# Patient Record
Sex: Female | Born: 1955
Health system: Southern US, Community
[De-identification: ages and names within clinical notes are randomized; demographics above are authoritative.]

## PROBLEM LIST (undated history)

## (undated) DIAGNOSIS — F419 Anxiety disorder, unspecified: Secondary | ICD-10-CM

## (undated) DIAGNOSIS — C801 Malignant (primary) neoplasm, unspecified: Secondary | ICD-10-CM

## (undated) DIAGNOSIS — F32A Depression, unspecified: Secondary | ICD-10-CM

## (undated) DIAGNOSIS — M1990 Unspecified osteoarthritis, unspecified site: Secondary | ICD-10-CM

## (undated) DIAGNOSIS — F329 Major depressive disorder, single episode, unspecified: Secondary | ICD-10-CM

## (undated) DIAGNOSIS — J45909 Unspecified asthma, uncomplicated: Secondary | ICD-10-CM

## (undated) DIAGNOSIS — M8580 Other specified disorders of bone density and structure, unspecified site: Secondary | ICD-10-CM

## (undated) DIAGNOSIS — H269 Unspecified cataract: Secondary | ICD-10-CM

## (undated) DIAGNOSIS — I10 Essential (primary) hypertension: Secondary | ICD-10-CM

## (undated) HISTORY — DX: Other specified disorders of bone density and structure, unspecified site: M85.80

## (undated) HISTORY — PX: TUBAL LIGATION: SHX77

## (undated) HISTORY — PX: COSMETIC SURGERY: SHX468

## (undated) HISTORY — DX: Unspecified cataract: H26.9

## (undated) HISTORY — DX: Unspecified osteoarthritis, unspecified site: M19.90

## (undated) HISTORY — DX: Essential (primary) hypertension: I10

## (undated) HISTORY — DX: Malignant (primary) neoplasm, unspecified: C80.1

## (undated) HISTORY — PX: AUGMENTATION MAMMAPLASTY: SUR837

## (undated) HISTORY — PX: TONSILLECTOMY: SUR1361

## (undated) HISTORY — DX: Anxiety disorder, unspecified: F41.9

## (undated) HISTORY — PX: CERVICAL DISCECTOMY: SHX98

## (undated) HISTORY — PX: APPENDECTOMY: SHX54

## (undated) HISTORY — PX: BUNIONECTOMY: SHX129

## (undated) HISTORY — DX: Depression, unspecified: F32.A

## (undated) HISTORY — DX: Major depressive disorder, single episode, unspecified: F32.9

## (undated) HISTORY — PX: REDUCTION MAMMAPLASTY: SUR839

## (undated) HISTORY — DX: Unspecified asthma, uncomplicated: J45.909

---

## 2009-01-04 ENCOUNTER — Ambulatory Visit (HOSPITAL_BASED_OUTPATIENT_CLINIC_OR_DEPARTMENT_OTHER): Admission: RE | Admit: 2009-01-04 | Discharge: 2009-01-05 | Payer: Self-pay | Admitting: Orthopedic Surgery

## 2010-12-28 LAB — BASIC METABOLIC PANEL
GFR calc non Af Amer: >60 mL/min (ref >60)
Glucose, Bld: 123 mg/dL — ABNORMAL HIGH (ref 70–99)
Potassium: 3.7 mEq/L (ref 3.5–5.1)
Sodium: 137 mEq/L (ref 135–145)

## 2010-12-28 LAB — POCT HEMOGLOBIN-HEMACUE: Hemoglobin: 14 g/dL (ref 12.0–15.0)

## 2011-01-31 NOTE — Op Note (Signed)
Monica Monroe, Monica Monroe               ACCOUNT NO.:  [id number]   MEDICAL RECORD NO.:  [id number]          PATIENT TYPE:  AMB   LOCATION:  DSC                          FACILITY:  [facility]   PHYSICIAN:  Robert A. Thurston Hole, M.D. DATE OF BIRTH:  02-12-1956   DATE OF PROCEDURE:  01/04/2009  DATE OF DISCHARGE:                               OPERATIVE REPORT   PREOPERATIVE DIAGNOSES:  1. Right shoulder adhesive capsulitis.  2. Right shoulder rotator cuff tenonitis with impingement.   POSTOPERATIVE DIAGNOSES:  1. Right shoulder adhesive capsulitis.  2. Right shoulder rotator cuff tenonitis with impingement.   PROCEDURES:  1. Right shoulder examination under anesthesia followed by      manipulation under anesthesia.  2. Right shoulder arthroscopic lysis of adhesions.  3. Right shoulder subacromial decompression.   SURGEON:  Elana Alm. Thurston Hole, MD   ASSISTANT:  Genene Churn. Barry Dienes, PA   ANESTHESIA:  General.   OPERATIVE TIME:  45 minutes.   COMPLICATIONS:  None.   INDICATIONS FOR PROCEDURE:  Monica Monroe is a 55 year old woman who has  had significant right shoulder pain and decrease range of motion and  adhesive capsulitis developing over the last 8-10 months, increasing in  nature with exam on MRI documenting this.  She has failed conservative  care and is now to undergo EUA, manipulation, arthroscopy, and lysis of  adhesions.   DESCRIPTION:  Monica Monroe was brought to the operating room on January 04, 2009, after an interscalene block was placed in the holding by  Anesthesia.  She was placed on the operating table in supine position.  After being placed under general anesthesia, her right shoulder was  examined.  Her initial range of motion showed forward flexion 150,  abduction 150, and internal and external rotation of 50 degrees.  A  gentle manipulation was carried out breaking up adhesions and improving  range of motion to full range of motion.  The shoulder remained stable  on  ligamentous exam.  She was then placed in a beach-chair position, and  her shoulder and arm were prepped using sterile DuraPrep and draped  using sterile technique.  Originally, through a posterior arthroscopic  portal, the arthroscope with a pump attached was placed into an anterior  portal, and arthroscopic probe was placed.  On initial inspection, the  articular cartilage in the glenohumeral joint was intact.  The anterior,  superior, and posterior labrum were intact.  The biceps tendon anchor  and biceps tendon was intact.  The anteroinferior labrum and  anteroinferior glenohumeral ligament complex was intact.  Rotator cuff  showed some mild interstitial tearing, but no significant tearing and it  was well attached in all its attachment points throughout.  The anterior  capsule and inferior capsule recess showed significant erythematous  synovitis consistent with adhesive capsulitis, this was partially  debrided and cauterized.  Subacromial space was entered and lateral  arthroscopic portal was made.  Moderately thickened bursitis was  resected.  The rotator cuff was somewhat inflamed and thickened on the  bursal surface, but no evidence of a tear.  Impingement was noted and  a  subacromial decompression was carried out removing 6-8 mm of the  undersurface of the anterior, anterolateral and anteromedial acromion,  and CA ligament release carried out as well.  The [facility] joint was not that  logic and thus was not disturbed.  After this was done, the shoulder  could be brought through a full range of motion with no impingement on  the rotator cuff.  At this point, it was felt that all pathology have  been satisfactorily addressed.  The instruments were removed.  The  portal was closed with 3-0 nylon suture.  The shoulder was injected with  80 mg of Depo-Medrol and 10 mL of 0.25% Marcaine with epinephrine with  half of this injected in the joint and half in the subacromial space due  to her  adhesive capsulitis.  Sterile dressings and a sling applied.  The  patient awakened and taken to the recovery room in stable condition.   FOLLOWUP CARE:  Monica Monroe will be followed overnight for observation  and aggressive range of motion exercises and neurovascular monitoring.  She will be discharged tomorrow on Dilaudid, Demerol, and Robaxin with  early aggressive physical therapy.  Seen back in the office in a week  for sutures out and followup.      Robert A. Thurston Hole, M.D.  Electronically Signed     RAW/MEDQ  D:  01/04/2009  T:  01/04/2009  Job:  [id number]

## 2016-02-04 ENCOUNTER — Encounter (INDEPENDENT_AMBULATORY_CARE_PROVIDER_SITE_OTHER): Payer: BLUE CROSS/BLUE SHIELD | Admitting: Ophthalmology

## 2016-02-04 DIAGNOSIS — H43813 Vitreous degeneration, bilateral: Secondary | ICD-10-CM | POA: Diagnosis not present

## 2016-02-04 DIAGNOSIS — H35033 Hypertensive retinopathy, bilateral: Secondary | ICD-10-CM

## 2016-02-04 DIAGNOSIS — I10 Essential (primary) hypertension: Secondary | ICD-10-CM

## 2016-02-04 DIAGNOSIS — H43821 Vitreomacular adhesion, right eye: Secondary | ICD-10-CM

## 2016-02-04 DIAGNOSIS — H59031 Cystoid macular edema following cataract surgery, right eye: Secondary | ICD-10-CM

## 2016-03-17 ENCOUNTER — Encounter (INDEPENDENT_AMBULATORY_CARE_PROVIDER_SITE_OTHER): Payer: BLUE CROSS/BLUE SHIELD | Admitting: Ophthalmology

## 2016-03-17 DIAGNOSIS — H35372 Puckering of macula, left eye: Secondary | ICD-10-CM | POA: Diagnosis not present

## 2016-03-17 DIAGNOSIS — H35033 Hypertensive retinopathy, bilateral: Secondary | ICD-10-CM

## 2016-03-17 DIAGNOSIS — H59031 Cystoid macular edema following cataract surgery, right eye: Secondary | ICD-10-CM | POA: Diagnosis not present

## 2016-03-17 DIAGNOSIS — H43813 Vitreous degeneration, bilateral: Secondary | ICD-10-CM | POA: Diagnosis not present

## 2016-03-17 DIAGNOSIS — I10 Essential (primary) hypertension: Secondary | ICD-10-CM

## 2016-04-28 ENCOUNTER — Encounter (INDEPENDENT_AMBULATORY_CARE_PROVIDER_SITE_OTHER): Payer: BLUE CROSS/BLUE SHIELD | Admitting: Ophthalmology

## 2016-04-28 DIAGNOSIS — I10 Essential (primary) hypertension: Secondary | ICD-10-CM

## 2016-04-28 DIAGNOSIS — H35033 Hypertensive retinopathy, bilateral: Secondary | ICD-10-CM | POA: Diagnosis not present

## 2016-04-28 DIAGNOSIS — H43813 Vitreous degeneration, bilateral: Secondary | ICD-10-CM

## 2016-04-28 DIAGNOSIS — H59031 Cystoid macular edema following cataract surgery, right eye: Secondary | ICD-10-CM

## 2016-04-28 DIAGNOSIS — H35372 Puckering of macula, left eye: Secondary | ICD-10-CM

## 2016-05-10 ENCOUNTER — Telehealth: Payer: Self-pay | Admitting: Gastroenterology

## 2016-05-10 NOTE — Telephone Encounter (Signed)
Patient states that she is due for another colonoscopy. Records received and placed on Dr. Woodward Ku desk for review.

## 2016-05-11 NOTE — Telephone Encounter (Signed)
Dr. Silverio Decamp reviewed records and has accepted patient. Due for recall colon 09/2017. I informed patient that we will contact her when time to schedule and that if she has any problems in the meantime to call our office.

## 2016-07-31 ENCOUNTER — Encounter (INDEPENDENT_AMBULATORY_CARE_PROVIDER_SITE_OTHER): Payer: BLUE CROSS/BLUE SHIELD | Admitting: Ophthalmology

## 2016-07-31 DIAGNOSIS — I10 Essential (primary) hypertension: Secondary | ICD-10-CM

## 2016-07-31 DIAGNOSIS — H33101 Unspecified retinoschisis, right eye: Secondary | ICD-10-CM | POA: Diagnosis not present

## 2016-07-31 DIAGNOSIS — H59031 Cystoid macular edema following cataract surgery, right eye: Secondary | ICD-10-CM

## 2016-07-31 DIAGNOSIS — H35372 Puckering of macula, left eye: Secondary | ICD-10-CM

## 2016-07-31 DIAGNOSIS — H35033 Hypertensive retinopathy, bilateral: Secondary | ICD-10-CM | POA: Diagnosis not present

## 2016-07-31 DIAGNOSIS — H43813 Vitreous degeneration, bilateral: Secondary | ICD-10-CM | POA: Diagnosis not present

## 2016-09-04 ENCOUNTER — Other Ambulatory Visit: Payer: Self-pay | Admitting: Family Medicine

## 2016-09-04 DIAGNOSIS — R748 Abnormal levels of other serum enzymes: Secondary | ICD-10-CM

## 2016-09-13 ENCOUNTER — Ambulatory Visit
Admission: RE | Admit: 2016-09-13 | Discharge: 2016-09-13 | Disposition: A | Discharge status: Home / Self Care | Payer: 59 | Source: Ambulatory Visit | Attending: Family Medicine | Admitting: Family Medicine

## 2016-09-13 DIAGNOSIS — R748 Abnormal levels of other serum enzymes: Secondary | ICD-10-CM

## 2017-07-31 ENCOUNTER — Ambulatory Visit (INDEPENDENT_AMBULATORY_CARE_PROVIDER_SITE_OTHER): Payer: BLUE CROSS/BLUE SHIELD | Admitting: Ophthalmology

## 2017-08-08 ENCOUNTER — Encounter (INDEPENDENT_AMBULATORY_CARE_PROVIDER_SITE_OTHER): Payer: 59 | Admitting: Ophthalmology

## 2017-08-08 DIAGNOSIS — H59031 Cystoid macular edema following cataract surgery, right eye: Secondary | ICD-10-CM

## 2017-08-08 DIAGNOSIS — H43813 Vitreous degeneration, bilateral: Secondary | ICD-10-CM

## 2017-08-08 DIAGNOSIS — I10 Essential (primary) hypertension: Secondary | ICD-10-CM

## 2017-08-08 DIAGNOSIS — H35033 Hypertensive retinopathy, bilateral: Secondary | ICD-10-CM

## 2017-08-08 DIAGNOSIS — H35372 Puckering of macula, left eye: Secondary | ICD-10-CM | POA: Diagnosis not present

## 2017-09-26 ENCOUNTER — Encounter: Payer: Self-pay | Admitting: Gastroenterology

## 2017-10-15 ENCOUNTER — Encounter: Payer: Self-pay | Admitting: Gastroenterology

## 2017-11-27 ENCOUNTER — Ambulatory Visit (AMBULATORY_SURGERY_CENTER): Payer: Self-pay

## 2017-11-27 ENCOUNTER — Other Ambulatory Visit: Payer: Self-pay

## 2017-11-27 VITALS — Ht 63.0 in | Wt 119.0 lb

## 2017-11-27 DIAGNOSIS — Z1211 Encounter for screening for malignant neoplasm of colon: Secondary | ICD-10-CM

## 2017-11-27 MED ORDER — NA SULFATE-K SULFATE-MG SULF 17.5-3.13-1.6 GM/177ML PO SOLN: 1.0000 | Freq: Once | ORAL | 0 refills | Status: AC

## 2017-11-27 NOTE — Progress Notes (Signed)
Denies allergies to eggs or soy products. Denies complication of anesthesia or sedation. Denies use of weight loss medication. Denies use of O2.   Emmi instructions declined.  

## 2017-11-29 ENCOUNTER — Encounter: Payer: Self-pay | Admitting: Gastroenterology

## 2017-12-11 ENCOUNTER — Other Ambulatory Visit: Payer: Self-pay

## 2017-12-11 ENCOUNTER — Encounter: Payer: Self-pay | Admitting: Gastroenterology

## 2017-12-11 ENCOUNTER — Ambulatory Visit (AMBULATORY_SURGERY_CENTER): Payer: Managed Care, Other (non HMO) | Admitting: Gastroenterology

## 2017-12-11 VITALS — BP 117/62 | HR 64 | Temp 97.8°F | Resp 11 | Ht 63.0 in | Wt 119.0 lb

## 2017-12-11 DIAGNOSIS — K635 Polyp of colon: Secondary | ICD-10-CM | POA: Diagnosis not present

## 2017-12-11 DIAGNOSIS — Z1211 Encounter for screening for malignant neoplasm of colon: Secondary | ICD-10-CM | POA: Diagnosis present

## 2017-12-11 DIAGNOSIS — D122 Benign neoplasm of ascending colon: Secondary | ICD-10-CM | POA: Diagnosis not present

## 2017-12-11 DIAGNOSIS — D125 Benign neoplasm of sigmoid colon: Secondary | ICD-10-CM

## 2017-12-11 MED ORDER — SODIUM CHLORIDE 0.9 % IV SOLN: 500.0000 mL | Freq: Once | INTRAVENOUS | Status: DC

## 2017-12-11 NOTE — Progress Notes (Signed)
Pt's states no medical or surgical changes since previsit or office visit. 

## 2017-12-11 NOTE — Progress Notes (Signed)
Called to room to assist during endoscopic procedure.  Patient ID and intended procedure confirmed with present staff. Received instructions for my participation in the procedure from the performing physician.  

## 2017-12-11 NOTE — Op Note (Signed)
[facility] Patient Name: Birtie Fellman Procedure Date: 12/11/2017 9:17 AM MRN: [id number] Endoscopist: Mauri Pole , MD Age: 62 Referring MD:  Date of Birth: 09-20-1955 Gender: Female Account #: [id number] Procedure:                Colonoscopy Indications:              Screening for colorectal malignant neoplasm, Last                            colonoscopy: 2009 Medicines:                Monitored Anesthesia Care Procedure:                Pre-Anesthesia Assessment:                           - Prior to the procedure, a History and Physical                            was performed, and patient medications and                            allergies were reviewed. The patient's tolerance of                            previous anesthesia was also reviewed. The risks                            and benefits of the procedure and the sedation                            options and risks were discussed with the patient.                            All questions were answered, and informed consent                            was obtained. Prior Anticoagulants: The patient has                            taken no previous anticoagulant or antiplatelet                            agents. ASA Grade Assessment: II - A patient with                            mild systemic disease. After reviewing the risks                            and benefits, the patient was deemed in                            satisfactory condition to undergo the procedure.  After obtaining informed consent, the colonoscope                            was passed under direct vision. Throughout the                            procedure, the patient's blood pressure, pulse, and                            oxygen saturations were monitored continuously. The                            Model PCF-[id number]) scope was introduced                            through the anus and advanced to the  the cecum,                            identified by appendiceal orifice and ileocecal                            valve. The colonoscopy was performed without                            difficulty. The patient tolerated the procedure                            well. The quality of the bowel preparation was                            excellent. The ileocecal valve, appendiceal                            orifice, and rectum were photographed. Scope In: 9:29:37 AM Scope Out: 9:46:25 AM Scope Withdrawal Time: 0 hours 13 minutes 26 seconds  Total Procedure Duration: 0 hours 16 minutes 48 seconds  Findings:                 The perianal and digital rectal examinations were                            normal.                           A 11 mm polyp was found in the ascending colon. The                            polyp was sessile. The polyp was removed with a                            cold snare. Resection and retrieval were complete.                           A 1 mm polyp was found in the sigmoid colon. The  polyp was sessile. The polyp was removed with a                            cold biopsy forceps. Resection and retrieval were                            complete.                           A 3 mm polyp was found in the sigmoid colon. The                            polyp was sessile. The polyp was removed with a                            cold snare. Resection and retrieval were complete.                           A few small and large-mouthed diverticula were                            found in the sigmoid colon.                           Non-bleeding internal hemorrhoids were found during                            retroflexion. The hemorrhoids were small. Complications:            No immediate complications. Estimated Blood Loss:     Estimated blood loss was minimal. Impression:               - One 11 mm polyp in the ascending colon, removed                             with a cold snare. Resected and retrieved.                           - One 1 mm polyp in the sigmoid colon, removed with                            a cold biopsy forceps. Resected and retrieved.                           - One 3 mm polyp in the sigmoid colon, removed with                            a cold snare. Resected and retrieved.                           - Diverticulosis in the sigmoid colon.                           - Non-bleeding internal hemorrhoids. Recommendation:           -  Patient has a contact number available for                            emergencies. The signs and symptoms of potential                            delayed complications were discussed with the                            patient. Return to normal activities tomorrow.                            Written discharge instructions were provided to the                            patient.                           - Resume previous diet.                           - Continue present medications.                           - Await pathology results.                           - Repeat colonoscopy in 3 years for surveillance                            based on pathology results. Mauri Pole, MD 12/11/2017 9:53:15 AM This report has been signed electronically.

## 2017-12-11 NOTE — Patient Instructions (Signed)
   pathology results on polyps removed in a letter from Dr Silverio Decamp  Information on polyps and hemorrhoids and diverticulosis given to you today   YOU HAD AN ENDOSCOPIC PROCEDURE TODAY AT [facility]:   Refer to the procedure report that was given to you for any specific questions about what was found during the examination.  If the procedure report does not answer your questions, please call your gastroenterologist to clarify.  If you requested that your care partner not be given the details of your procedure findings, then the procedure report has been included in a sealed envelope for you to review at your convenience later.  YOU SHOULD EXPECT: Some feelings of bloating in the abdomen. Passage of more gas than usual.  Walking can help get rid of the air that was put into your GI tract during the procedure and reduce the bloating. If you had a lower endoscopy (such as a colonoscopy or flexible sigmoidoscopy) you may notice spotting of blood in your stool or on the toilet paper. If you underwent a bowel prep for your procedure, you may not have a normal bowel movement for a few days.  Please Note:  You might notice some irritation and congestion in your nose or some drainage.  This is from the oxygen used during your procedure.  There is no need for concern and it should clear up in a day or so.  SYMPTOMS TO REPORT IMMEDIATELY:   Following lower endoscopy (colonoscopy or flexible sigmoidoscopy):  Excessive amounts of blood in the stool  Significant tenderness or worsening of abdominal pains  Swelling of the abdomen that is new, acute  Fever of 100F or higher    For urgent or emergent issues, a gastroenterologist can be reached at any hour by calling [phone].   DIET:  We do recommend a small meal at first, but then you may proceed to your regular diet.  Drink plenty of fluids but you should avoid alcoholic beverages for 24 hours.  ACTIVITY:  You should plan to take  it easy for the rest of today and you should NOT DRIVE or use heavy machinery until tomorrow (because of the sedation medicines used during the test).    FOLLOW UP: Our staff will call the number listed on your records the next business day following your procedure to check on you and address any questions or concerns that you may have regarding the information given to you following your procedure. If we do not reach you, we will leave a message.  However, if you are feeling well and you are not experiencing any problems, there is no need to return our call.  We will assume that you have returned to your regular daily activities without incident.  If any biopsies were taken you will be contacted by phone or by letter within the next 1-3 weeks.  Please call us at [phone] if you have not heard about the biopsies in 3 weeks.    SIGNATURES/CONFIDENTIALITY: You and/or your care partner have signed paperwork which will be entered into your electronic medical record.  These signatures attest to the fact that that the information above on your After Visit Summary has been reviewed and is understood.  Full responsibility of the confidentiality of this discharge information lies with you and/or your care-partner.

## 2017-12-11 NOTE — Progress Notes (Signed)
Report to PACU, RN, vss, BBS= Clear.  

## 2017-12-12 ENCOUNTER — Telehealth: Payer: Self-pay

## 2017-12-12 NOTE — Telephone Encounter (Signed)
  Follow up Call-  Call back number 12/11/2017  Post procedure Call Back phone  # [phone]  Permission to leave phone message Yes  Some recent data might be hidden     Patient questions:  Do you have a fever, pain , or abdominal swelling? No. Pain Score  0 *  Have you tolerated food without any problems? Yes.    Have you been able to return to your normal activities? Yes.    Do you have any questions about your discharge instructions: Diet   No. Medications  No. Follow up visit  No.  Do you have questions or concerns about your Care? No.  Actions: * If pain score is 4 or above: No action needed, pain <4.

## 2017-12-14 ENCOUNTER — Encounter: Payer: Self-pay | Admitting: Gastroenterology

## 2018-02-26 ENCOUNTER — Other Ambulatory Visit: Payer: Self-pay | Admitting: Obstetrics and Gynecology

## 2018-02-26 DIAGNOSIS — R928 Other abnormal and inconclusive findings on diagnostic imaging of breast: Secondary | ICD-10-CM

## 2018-02-28 ENCOUNTER — Other Ambulatory Visit: Payer: Self-pay | Admitting: Obstetrics and Gynecology

## 2018-02-28 ENCOUNTER — Ambulatory Visit
Admission: RE | Admit: 2018-02-28 | Discharge: 2018-02-28 | Disposition: A | Discharge status: Home / Self Care | Payer: Managed Care, Other (non HMO) | Source: Ambulatory Visit | Attending: Obstetrics and Gynecology | Admitting: Obstetrics and Gynecology

## 2018-02-28 DIAGNOSIS — R928 Other abnormal and inconclusive findings on diagnostic imaging of breast: Secondary | ICD-10-CM

## 2018-02-28 DIAGNOSIS — N6310 Unspecified lump in the right breast, unspecified quadrant: Secondary | ICD-10-CM

## 2018-08-08 ENCOUNTER — Encounter (INDEPENDENT_AMBULATORY_CARE_PROVIDER_SITE_OTHER): Payer: 59 | Admitting: Ophthalmology

## 2018-08-26 ENCOUNTER — Encounter (INDEPENDENT_AMBULATORY_CARE_PROVIDER_SITE_OTHER): Payer: Managed Care, Other (non HMO) | Admitting: Ophthalmology

## 2018-08-27 ENCOUNTER — Encounter (INDEPENDENT_AMBULATORY_CARE_PROVIDER_SITE_OTHER): Payer: Managed Care, Other (non HMO) | Admitting: Ophthalmology

## 2018-08-27 DIAGNOSIS — I10 Essential (primary) hypertension: Secondary | ICD-10-CM

## 2018-08-27 DIAGNOSIS — H43813 Vitreous degeneration, bilateral: Secondary | ICD-10-CM

## 2018-08-27 DIAGNOSIS — H35372 Puckering of macula, left eye: Secondary | ICD-10-CM

## 2018-08-27 DIAGNOSIS — H35033 Hypertensive retinopathy, bilateral: Secondary | ICD-10-CM | POA: Diagnosis not present

## 2018-09-05 ENCOUNTER — Other Ambulatory Visit: Payer: Self-pay | Admitting: Obstetrics and Gynecology

## 2018-09-05 ENCOUNTER — Ambulatory Visit
Admission: RE | Admit: 2018-09-05 | Discharge: 2018-09-05 | Disposition: A | Discharge status: Home / Self Care | Payer: Managed Care, Other (non HMO) | Source: Ambulatory Visit | Attending: Obstetrics and Gynecology | Admitting: Obstetrics and Gynecology

## 2018-09-05 DIAGNOSIS — N6489 Other specified disorders of breast: Secondary | ICD-10-CM

## 2018-09-05 DIAGNOSIS — N6310 Unspecified lump in the right breast, unspecified quadrant: Secondary | ICD-10-CM

## 2019-03-10 ENCOUNTER — Other Ambulatory Visit: Payer: Self-pay | Admitting: Obstetrics and Gynecology

## 2019-03-10 ENCOUNTER — Ambulatory Visit
Admission: RE | Admit: 2019-03-10 | Discharge: 2019-03-10 | Disposition: A | Discharge status: Home / Self Care | Payer: Managed Care, Other (non HMO) | Source: Ambulatory Visit | Attending: Obstetrics and Gynecology | Admitting: Obstetrics and Gynecology

## 2019-03-10 ENCOUNTER — Other Ambulatory Visit: Payer: Self-pay

## 2019-03-10 DIAGNOSIS — N6489 Other specified disorders of breast: Secondary | ICD-10-CM

## 2019-06-26 DIAGNOSIS — G4725 Circadian rhythm sleep disorder, jet lag type: Secondary | ICD-10-CM | POA: Diagnosis not present

## 2019-06-26 DIAGNOSIS — I10 Essential (primary) hypertension: Secondary | ICD-10-CM | POA: Diagnosis not present

## 2019-06-26 DIAGNOSIS — R7301 Impaired fasting glucose: Secondary | ICD-10-CM | POA: Diagnosis not present

## 2019-06-26 DIAGNOSIS — Z86010 Personal history of colonic polyps: Secondary | ICD-10-CM | POA: Diagnosis not present

## 2019-06-26 DIAGNOSIS — H0019 Chalazion unspecified eye, unspecified eyelid: Secondary | ICD-10-CM | POA: Diagnosis not present

## 2019-06-26 DIAGNOSIS — M5090 Cervical disc disorder, unspecified, unspecified cervical region: Secondary | ICD-10-CM | POA: Diagnosis not present

## 2019-08-25 DIAGNOSIS — R69 Illness, unspecified: Secondary | ICD-10-CM | POA: Diagnosis not present

## 2019-09-04 DIAGNOSIS — L720 Epidermal cyst: Secondary | ICD-10-CM | POA: Diagnosis not present

## 2019-09-04 DIAGNOSIS — B078 Other viral warts: Secondary | ICD-10-CM | POA: Diagnosis not present

## 2019-09-04 DIAGNOSIS — M62838 Other muscle spasm: Secondary | ICD-10-CM | POA: Diagnosis not present

## 2019-09-04 DIAGNOSIS — D2239 Melanocytic nevi of other parts of face: Secondary | ICD-10-CM | POA: Diagnosis not present

## 2019-09-04 DIAGNOSIS — Z780 Asymptomatic menopausal state: Secondary | ICD-10-CM | POA: Diagnosis not present

## 2019-09-04 DIAGNOSIS — G629 Polyneuropathy, unspecified: Secondary | ICD-10-CM | POA: Diagnosis not present

## 2019-09-04 DIAGNOSIS — I10 Essential (primary) hypertension: Secondary | ICD-10-CM | POA: Diagnosis not present

## 2019-09-04 DIAGNOSIS — Z85828 Personal history of other malignant neoplasm of skin: Secondary | ICD-10-CM | POA: Diagnosis not present

## 2019-09-04 DIAGNOSIS — Z79890 Hormone replacement therapy (postmenopausal): Secondary | ICD-10-CM | POA: Diagnosis not present

## 2019-09-04 DIAGNOSIS — L570 Actinic keratosis: Secondary | ICD-10-CM | POA: Diagnosis not present

## 2019-09-04 DIAGNOSIS — L820 Inflamed seborrheic keratosis: Secondary | ICD-10-CM | POA: Diagnosis not present

## 2019-09-04 DIAGNOSIS — Z23 Encounter for immunization: Secondary | ICD-10-CM | POA: Diagnosis not present

## 2019-10-06 DIAGNOSIS — H52203 Unspecified astigmatism, bilateral: Secondary | ICD-10-CM | POA: Diagnosis not present

## 2019-10-06 DIAGNOSIS — H35373 Puckering of macula, bilateral: Secondary | ICD-10-CM | POA: Diagnosis not present

## 2019-10-06 DIAGNOSIS — Z961 Presence of intraocular lens: Secondary | ICD-10-CM | POA: Diagnosis not present

## 2019-10-21 DIAGNOSIS — Z20822 Contact with and (suspected) exposure to covid-19: Secondary | ICD-10-CM | POA: Diagnosis not present

## 2019-10-21 DIAGNOSIS — R030 Elevated blood-pressure reading, without diagnosis of hypertension: Secondary | ICD-10-CM | POA: Diagnosis not present

## 2019-12-29 DIAGNOSIS — Z13220 Encounter for screening for lipoid disorders: Secondary | ICD-10-CM | POA: Diagnosis not present

## 2019-12-29 DIAGNOSIS — I10 Essential (primary) hypertension: Secondary | ICD-10-CM | POA: Diagnosis not present

## 2019-12-29 DIAGNOSIS — Z0000 Encounter for general adult medical examination without abnormal findings: Secondary | ICD-10-CM | POA: Diagnosis not present

## 2019-12-29 DIAGNOSIS — Z86010 Personal history of colonic polyps: Secondary | ICD-10-CM | POA: Diagnosis not present

## 2019-12-29 DIAGNOSIS — H0019 Chalazion unspecified eye, unspecified eyelid: Secondary | ICD-10-CM | POA: Diagnosis not present

## 2019-12-29 DIAGNOSIS — M5090 Cervical disc disorder, unspecified, unspecified cervical region: Secondary | ICD-10-CM | POA: Diagnosis not present

## 2019-12-29 DIAGNOSIS — G4725 Circadian rhythm sleep disorder, jet lag type: Secondary | ICD-10-CM | POA: Diagnosis not present

## 2019-12-29 DIAGNOSIS — Z1159 Encounter for screening for other viral diseases: Secondary | ICD-10-CM | POA: Diagnosis not present

## 2019-12-29 DIAGNOSIS — R7301 Impaired fasting glucose: Secondary | ICD-10-CM | POA: Diagnosis not present

## 2020-02-09 ENCOUNTER — Other Ambulatory Visit: Payer: Self-pay | Admitting: Obstetrics and Gynecology

## 2020-02-09 DIAGNOSIS — N6489 Other specified disorders of breast: Secondary | ICD-10-CM

## 2020-02-23 DIAGNOSIS — R69 Illness, unspecified: Secondary | ICD-10-CM | POA: Diagnosis not present

## 2020-02-26 DIAGNOSIS — L0100 Impetigo, unspecified: Secondary | ICD-10-CM | POA: Diagnosis not present

## 2020-03-01 DIAGNOSIS — N760 Acute vaginitis: Secondary | ICD-10-CM | POA: Diagnosis not present

## 2020-03-01 DIAGNOSIS — L292 Pruritus vulvae: Secondary | ICD-10-CM | POA: Diagnosis not present

## 2020-03-01 DIAGNOSIS — N9089 Other specified noninflammatory disorders of vulva and perineum: Secondary | ICD-10-CM | POA: Diagnosis not present

## 2020-03-01 DIAGNOSIS — R350 Frequency of micturition: Secondary | ICD-10-CM | POA: Diagnosis not present

## 2020-03-04 DIAGNOSIS — B029 Zoster without complications: Secondary | ICD-10-CM | POA: Diagnosis not present

## 2020-03-12 ENCOUNTER — Other Ambulatory Visit: Payer: Self-pay

## 2020-03-12 ENCOUNTER — Ambulatory Visit: Payer: Managed Care, Other (non HMO)

## 2020-03-12 ENCOUNTER — Ambulatory Visit
Admission: RE | Admit: 2020-03-12 | Discharge: 2020-03-12 | Disposition: A | Discharge status: Home / Self Care | Payer: Medicare HMO | Source: Ambulatory Visit | Attending: Obstetrics and Gynecology | Admitting: Obstetrics and Gynecology

## 2020-03-12 DIAGNOSIS — R922 Inconclusive mammogram: Secondary | ICD-10-CM | POA: Diagnosis not present

## 2020-03-12 DIAGNOSIS — N6489 Other specified disorders of breast: Secondary | ICD-10-CM

## 2020-04-20 DIAGNOSIS — N958 Other specified menopausal and perimenopausal disorders: Secondary | ICD-10-CM | POA: Diagnosis not present

## 2020-04-20 DIAGNOSIS — Z01419 Encounter for gynecological examination (general) (routine) without abnormal findings: Secondary | ICD-10-CM | POA: Diagnosis not present

## 2020-04-20 DIAGNOSIS — Z124 Encounter for screening for malignant neoplasm of cervix: Secondary | ICD-10-CM | POA: Diagnosis not present

## 2020-04-20 DIAGNOSIS — Z6821 Body mass index (BMI) 21.0-21.9, adult: Secondary | ICD-10-CM | POA: Diagnosis not present

## 2020-05-20 DIAGNOSIS — H00011 Hordeolum externum right upper eyelid: Secondary | ICD-10-CM | POA: Diagnosis not present

## 2020-05-21 DIAGNOSIS — H35373 Puckering of macula, bilateral: Secondary | ICD-10-CM | POA: Diagnosis not present

## 2020-05-21 DIAGNOSIS — H00021 Hordeolum internum right upper eyelid: Secondary | ICD-10-CM | POA: Diagnosis not present

## 2020-05-21 DIAGNOSIS — H04123 Dry eye syndrome of bilateral lacrimal glands: Secondary | ICD-10-CM | POA: Diagnosis not present

## 2020-06-04 DIAGNOSIS — M545 Low back pain: Secondary | ICD-10-CM | POA: Diagnosis not present

## 2020-06-04 DIAGNOSIS — M542 Cervicalgia: Secondary | ICD-10-CM | POA: Diagnosis not present

## 2020-06-07 DIAGNOSIS — M8580 Other specified disorders of bone density and structure, unspecified site: Secondary | ICD-10-CM | POA: Diagnosis not present

## 2020-06-07 DIAGNOSIS — M8588 Other specified disorders of bone density and structure, other site: Secondary | ICD-10-CM | POA: Diagnosis not present

## 2020-06-07 DIAGNOSIS — B354 Tinea corporis: Secondary | ICD-10-CM | POA: Diagnosis not present

## 2020-06-29 DIAGNOSIS — M5090 Cervical disc disorder, unspecified, unspecified cervical region: Secondary | ICD-10-CM | POA: Diagnosis not present

## 2020-06-29 DIAGNOSIS — R7301 Impaired fasting glucose: Secondary | ICD-10-CM | POA: Diagnosis not present

## 2020-06-29 DIAGNOSIS — R748 Abnormal levels of other serum enzymes: Secondary | ICD-10-CM | POA: Diagnosis not present

## 2020-06-29 DIAGNOSIS — Z86010 Personal history of colonic polyps: Secondary | ICD-10-CM | POA: Diagnosis not present

## 2020-06-29 DIAGNOSIS — G4725 Circadian rhythm sleep disorder, jet lag type: Secondary | ICD-10-CM | POA: Diagnosis not present

## 2020-06-29 DIAGNOSIS — I10 Essential (primary) hypertension: Secondary | ICD-10-CM | POA: Diagnosis not present

## 2020-06-29 DIAGNOSIS — Z79899 Other long term (current) drug therapy: Secondary | ICD-10-CM | POA: Diagnosis not present

## 2020-06-30 ENCOUNTER — Ambulatory Visit: Payer: Medicare HMO | Admitting: Podiatry

## 2020-06-30 ENCOUNTER — Other Ambulatory Visit: Payer: Self-pay

## 2020-06-30 ENCOUNTER — Ambulatory Visit (INDEPENDENT_AMBULATORY_CARE_PROVIDER_SITE_OTHER): Payer: Medicare HMO

## 2020-06-30 DIAGNOSIS — M7741 Metatarsalgia, right foot: Secondary | ICD-10-CM

## 2020-06-30 DIAGNOSIS — M21619 Bunion of unspecified foot: Secondary | ICD-10-CM | POA: Diagnosis not present

## 2020-06-30 DIAGNOSIS — M7742 Metatarsalgia, left foot: Secondary | ICD-10-CM

## 2020-07-01 ENCOUNTER — Encounter: Payer: Self-pay | Admitting: Podiatry

## 2020-07-01 NOTE — Progress Notes (Signed)
Subjective:  Patient ID: Monica Monroe, female    DOB: 16-Jan-1956,  MRN: [id number]  Chief Complaint  Patient presents with  . Bunions    Pt is concerned about the bunions. Stated that she gets alot of cramping in her foot when she puts on shoes.     64 y.o. female presents with the above complaint.  Patient presents with complaints of bilateral forefoot pain that has been going on for quite some time.  Patient is also concerned about very mild bunion deformities as well.  Patient states that is painful when she walks on it and especially at night by the time the end of the day happens.  Patient states that it is starting to go up into her knees and going worse.  She also gets a lot of cramping on her foot.  She has a high arch foot.  She has not seen anyone else prior to seeing me.  She would like to discuss treatment options.  She denies any other acute complaints.  Her pain scale is 7 out of 10 at its worse.   Review of Systems: Negative except as noted in the HPI. Denies N/V/F/Ch.  Past Medical History:  Diagnosis Date  . Anxiety   . Arthritis   . Asthma   . Cancer ([place])   . Cataract   . Depression   . Hypertension   . Osteopenia     Current Outpatient Medications:  .  HYDROcodone-acetaminophen (NORCO/VICODIN) 5-325 MG tablet, Take by mouth., Disp: , Rfl:  .  Baclofen 5 MG TABS, Take 1 tablet by mouth 2 (two) times daily as needed., Disp: , Rfl:  .  calcium carbonate (OS-CAL) 1250 (500 Ca) MG chewable tablet, Chew 1 tablet by mouth daily., Disp: , Rfl:  .  cephALEXin (KEFLEX) 500 MG capsule, Take 500 mg by mouth every 8 (eight) hours., Disp: , Rfl:  .  doxycycline (VIBRA-TABS) 100 MG tablet, Take 100 mg by mouth 2 (two) times daily., Disp: , Rfl:  .  doxycycline (VIBRAMYCIN) 100 MG capsule, Take 100 mg by mouth 2 (two) times daily., Disp: , Rfl:  .  estrogen, conjugated,-medroxyprogesterone (PREMPRO) 0.625-2.5 MG tablet, Take 1 tablet by mouth daily. Takes one half  tablet daily., Disp: , Rfl:  .  fluconazole (DIFLUCAN) 150 MG tablet, Take 150 mg by mouth daily as needed., Disp: , Rfl:  .  gabapentin (NEURONTIN) 300 MG capsule, Take 300 mg by mouth daily., Disp: , Rfl:  .  hydrochlorothiazide (HYDRODIURIL) 12.5 MG tablet, Take 12.5 mg by mouth every morning., Disp: , Rfl:  .  HYDROcodone-acetaminophen (NORCO) 7.5-325 MG tablet, Take by mouth., Disp: , Rfl:  .  ketoconazole (NIZORAL) 2 % cream, Apply topically daily., Disp: , Rfl:  .  lidocaine (XYLOCAINE) 5 % ointment, SMARTSIG:Topical 1-4 Times Daily PRN, Disp: , Rfl:  .  naproxen sodium (ALEVE) 220 MG tablet, Take by mouth., Disp: , Rfl:  .  olmesartan-hydrochlorothiazide (BENICAR HCT) 20-12.5 MG tablet, Take 1 tablet by mouth daily. Takes half of a tablet daily., Disp: , Rfl:  .  OVER THE COUNTER MEDICATION, Stool softener daily. One tablet daily., Disp: , Rfl:  .  OVER THE COUNTER MEDICATION, Vitamin D 3. One tablet daily., Disp: , Rfl:  .  tobramycin-dexamethasone (TOBRADEX) ophthalmic solution, Place 1 drop into the right eye 4 (four) times daily., Disp: , Rfl:  .  valACYclovir (VALTREX) 1000 MG tablet, Take 1,000 mg by mouth 3 (three) times daily., Disp: , Rfl:  .  zolpidem (AMBIEN) 10 MG tablet, Take 10 mg by mouth at bedtime as needed for sleep. Half a tablet PRN., Disp: , Rfl:   Current Facility-Administered Medications:  .  0.9 %  sodium chloride infusion, 500 mL, Intravenous, Once, Nandigam, Venia Minks, MD  Social History   Tobacco Use  Smoking Status Never Smoker  Smokeless Tobacco Never Used    Allergies  Allergen Reactions  . Percocet [Oxycodone-Acetaminophen] Swelling   Objective:  There were no vitals filed for this visit. There is no height or weight on file to calculate BMI. Constitutional Well developed. Well nourished.  Vascular Dorsalis pedis pulses palpable bilaterally. Posterior tibial pulses palpable bilaterally. Capillary refill normal to all digits.  No cyanosis or  clubbing noted. Pedal hair growth normal.  Neurologic Normal speech. Oriented to person, place, and time. Epicritic sensation to light touch grossly present bilaterally.  Dermatologic Nails well groomed and normal in appearance. No open wounds. No skin lesions.  Orthopedic:  Generalized pain on palpation across the ball of the foot bilaterally.  Gait examination shows cavus foot structure with anterior cavus leading to aggressive forefoot pressure.  Very mild bunion deformity is noted overall in good position alignment.   Radiographs: 3 views of skeletally mature adult bilateral foot: There is there is increasing calcaneal inclination angle decrease in talar declination angle no break in the cyma line noted.  Plantarflexed forefoot noted mildly.  No other bony abnormalities identified.  No fractures noted.  No bunion deformity noted. Assessment:   1. Metatarsalgia of both feet   2. Bunion    Plan:  Patient was evaluated and treated and all questions answered.  Bilateral metatarsalgia noted secondary to underlying anterior cavus foot structure -I explained to the patient the etiology of metatarsalgia and various treatment options were discussed.  Ultimately given the foot structure that she has she is putting aggressive pressure to the forefoot especially in certain type of shoe gear that involves heels.  I discussed with the patient shoe gear modification extensive detail.  Also believe she would benefit from metatarsal pads to provide support/correction to the forefoot therefore ideally decrease some of the pain.  Metatarsal pads were dispensed. -Ultimately if the pads are helpful I will discuss further orthotics management.  No follow-ups on file.

## 2020-07-03 IMAGING — MG DIGITAL DIAGNOSTIC BILATERAL MAMMOGRAM WITH IMPLANTS, CAD AND TO
8 of 14 series · 8 of 34 positions shown · non-contrast
Comparison: Previous exam(s).

CLINICAL DATA: Short-term follow-up for a probably benign asymmetry
in the right breast, initially evaluated on 02/28/2018.

EXAM:
2D DIGITAL DIAGNOSTIC BILATERAL MAMMOGRAM WITH IMPLANTS, CAD AND
ADJUNCT TOMO
The patient has retropectoral implants. Standard and implant
displaced views were performed.

[L MLO]
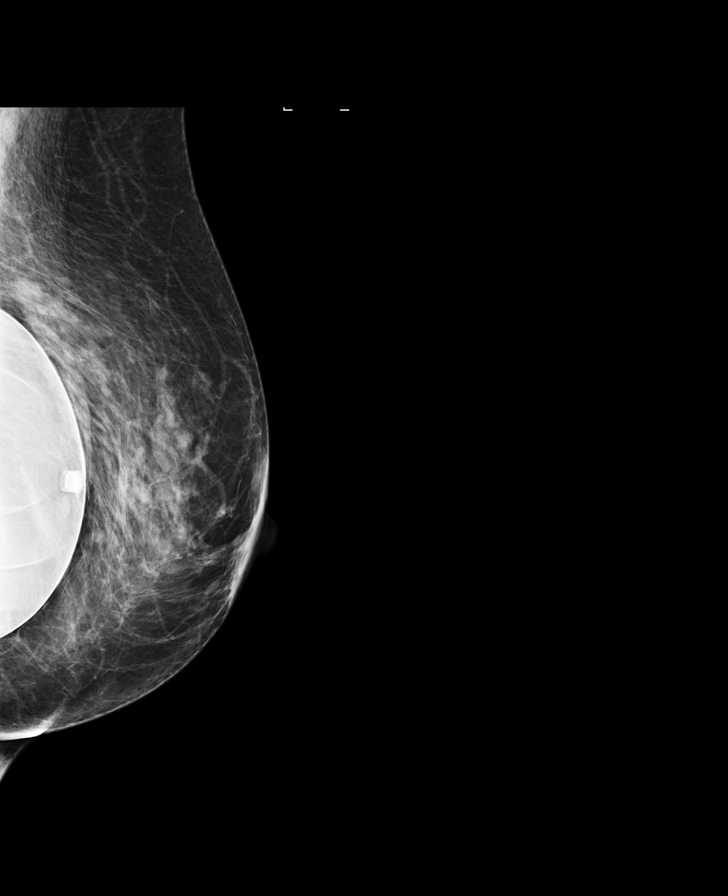

[L CC]
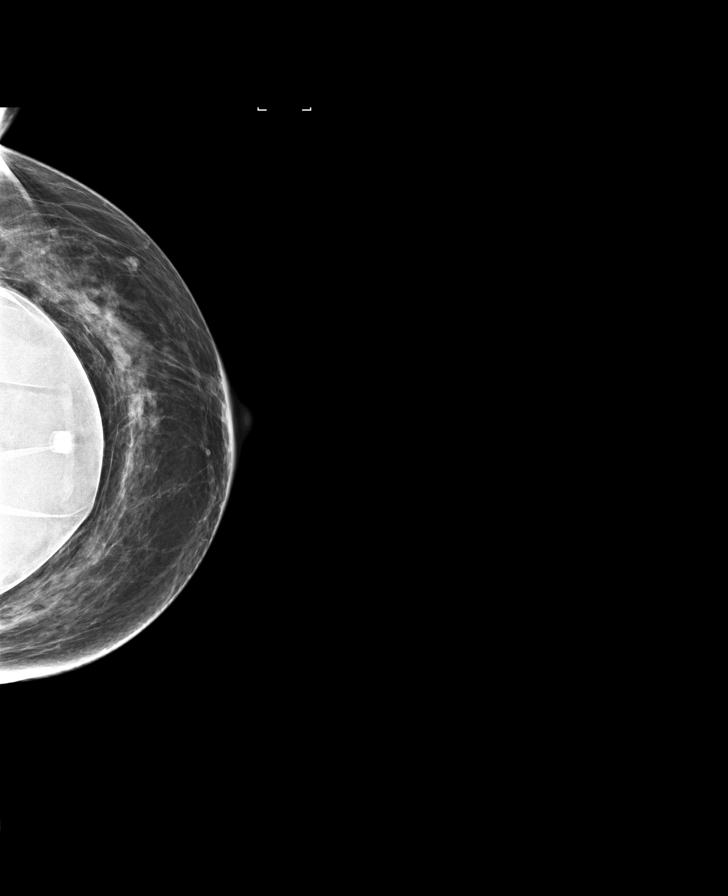

[R MLO]
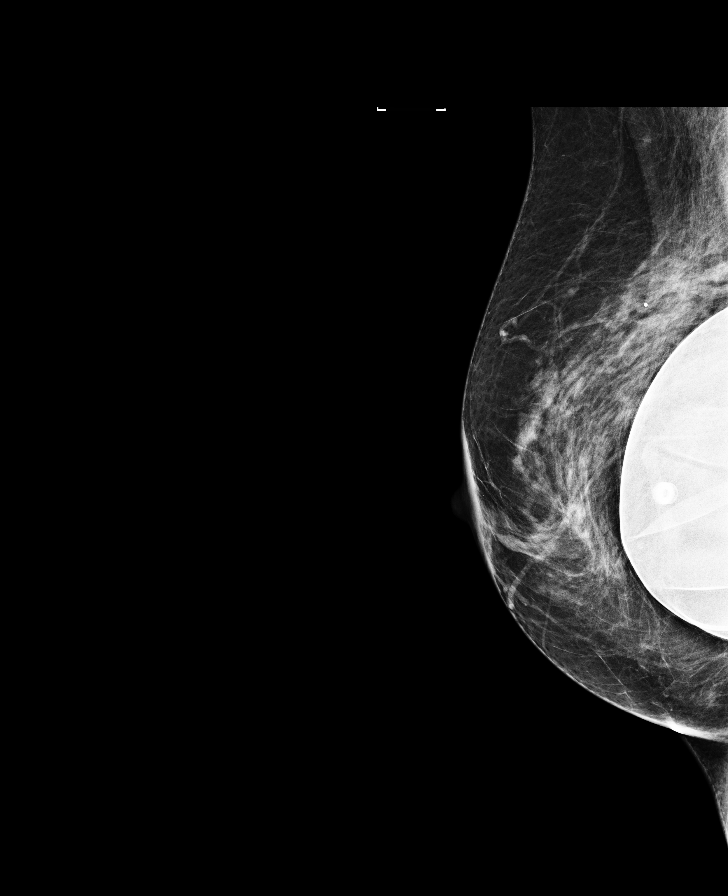

[R CC]
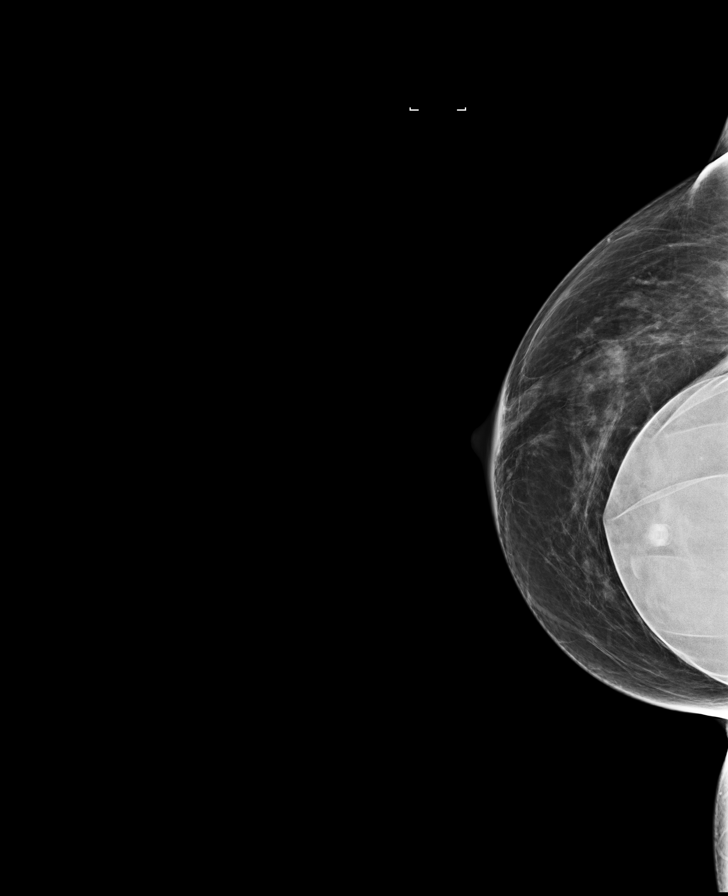

[R CC synth-2D (1 of 2)]
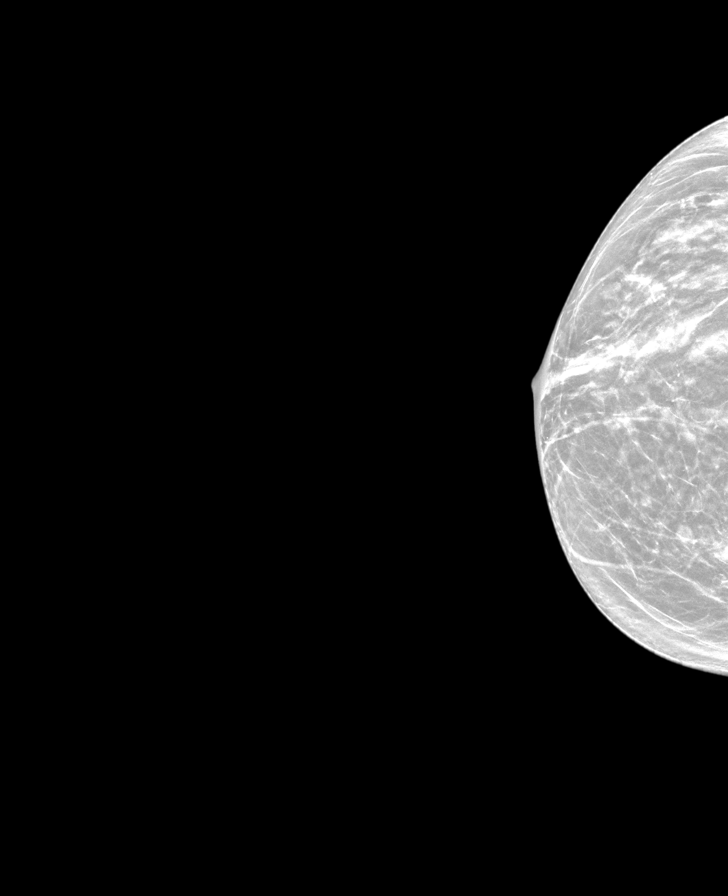

[R MLO synth-2D]
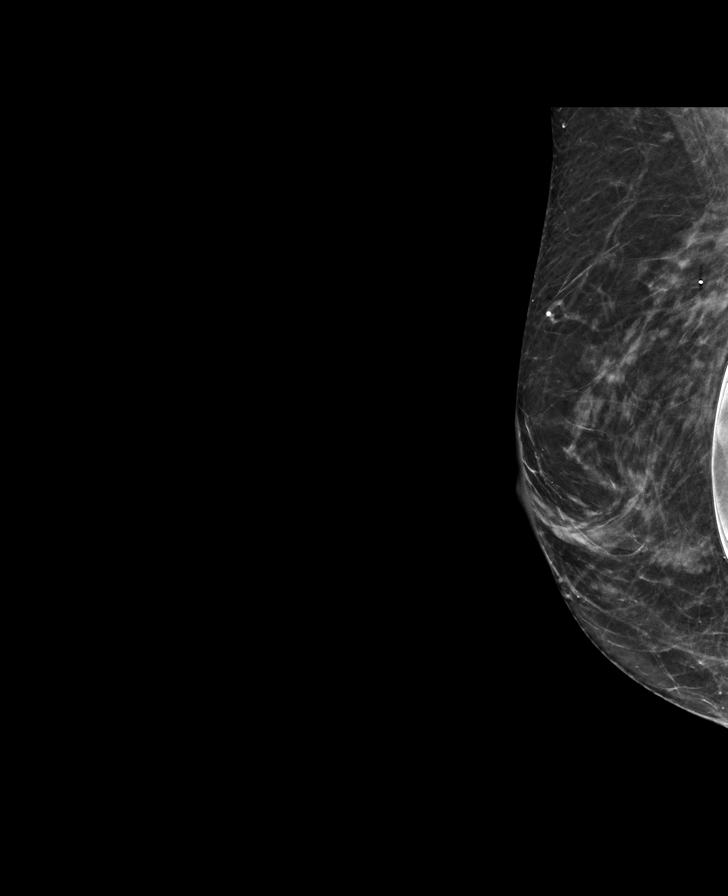

[R CC synth-2D (2 of 2)]
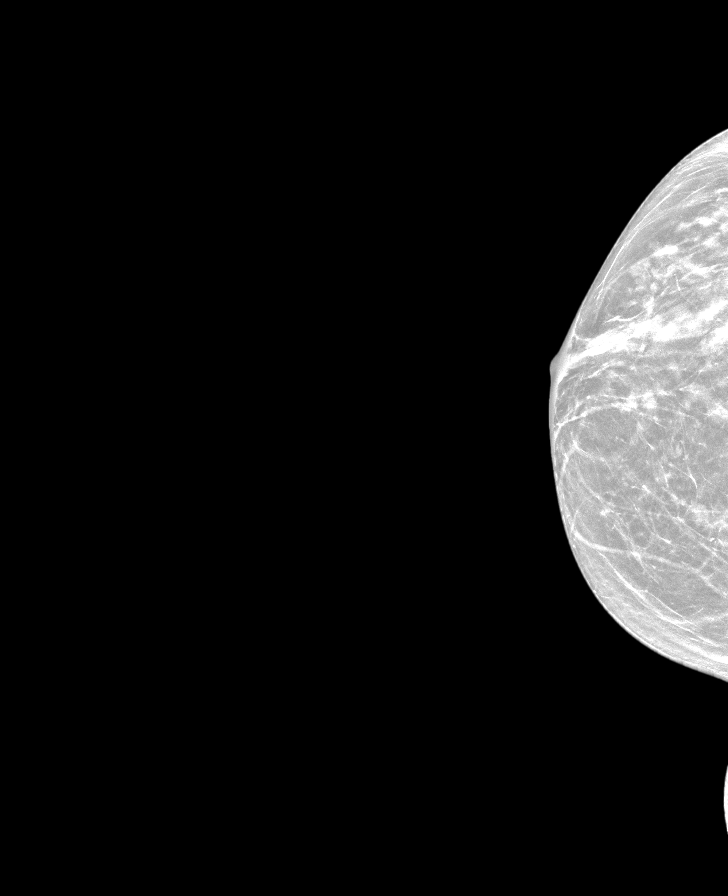

[L CC synth-2D]
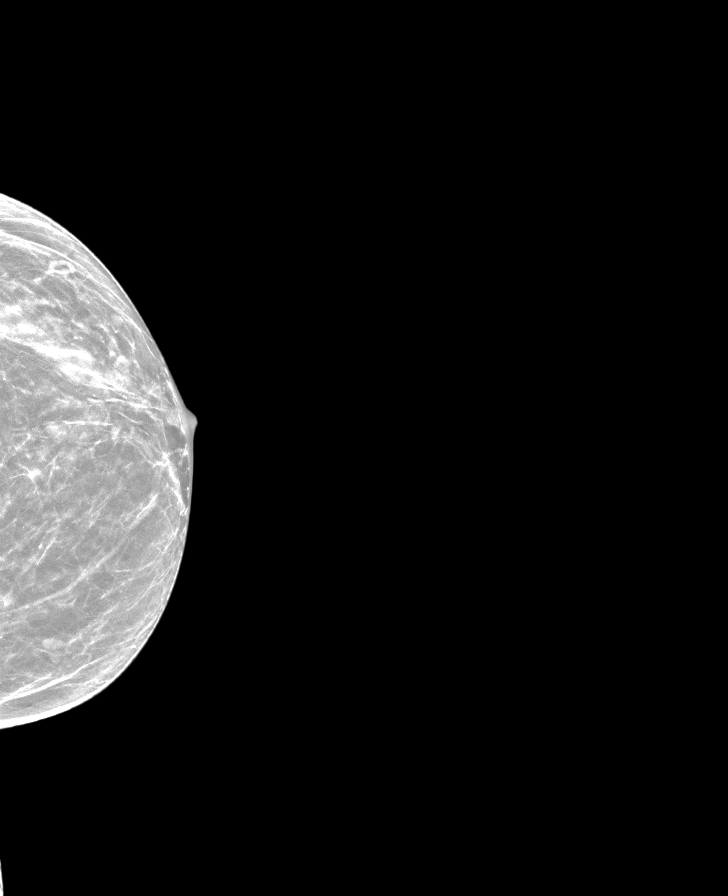

[8 of 34 positions shown; findings below may reference images not displayed]

ACR Breast Density Category b: There are scattered areas of
fibroglandular density.
FINDINGS: The area of asymmetry noted in the posterior slightly inferior right
breast on the implant displacement MLO view is unchanged.

There are no new or suspicious masses, no areas of nonsurgical
architectural distortion and no other areas of significant
asymmetry.

Mammographic images were processed with CAD.
IMPRESSION: 1. Probably benign area of asymmetry in the right breast, stable for
1 year. One additional diagnostic study in 1 year to provide 2 years
of stability is recommended.

RECOMMENDATION:
Diagnostic mammography in 1 year.

I have discussed the findings and recommendations with the patient.
Results were also provided in writing at the conclusion of the
visit. If applicable, a reminder letter will be sent to the patient
regarding the next appointment.

BI-RADS CATEGORY  3: Probably benign.

## 2020-08-06 ENCOUNTER — Ambulatory Visit: Payer: Medicare HMO | Admitting: Podiatry

## 2020-08-06 DIAGNOSIS — M21612 Bunion of left foot: Secondary | ICD-10-CM | POA: Diagnosis not present

## 2020-08-06 DIAGNOSIS — M21611 Bunion of right foot: Secondary | ICD-10-CM | POA: Diagnosis not present

## 2020-08-06 DIAGNOSIS — M7742 Metatarsalgia, left foot: Secondary | ICD-10-CM | POA: Diagnosis not present

## 2020-08-06 DIAGNOSIS — M7741 Metatarsalgia, right foot: Secondary | ICD-10-CM | POA: Diagnosis not present

## 2020-08-24 DIAGNOSIS — R69 Illness, unspecified: Secondary | ICD-10-CM | POA: Diagnosis not present

## 2020-09-23 DIAGNOSIS — M5412 Radiculopathy, cervical region: Secondary | ICD-10-CM | POA: Diagnosis not present

## 2020-09-23 DIAGNOSIS — I10 Essential (primary) hypertension: Secondary | ICD-10-CM | POA: Diagnosis not present

## 2020-09-29 DIAGNOSIS — R200 Anesthesia of skin: Secondary | ICD-10-CM | POA: Diagnosis not present

## 2020-09-29 DIAGNOSIS — M5412 Radiculopathy, cervical region: Secondary | ICD-10-CM | POA: Diagnosis not present

## 2020-09-29 DIAGNOSIS — I10 Essential (primary) hypertension: Secondary | ICD-10-CM | POA: Diagnosis not present

## 2020-10-07 DIAGNOSIS — H52203 Unspecified astigmatism, bilateral: Secondary | ICD-10-CM | POA: Diagnosis not present

## 2020-10-07 DIAGNOSIS — H35373 Puckering of macula, bilateral: Secondary | ICD-10-CM | POA: Diagnosis not present

## 2020-10-07 DIAGNOSIS — Z961 Presence of intraocular lens: Secondary | ICD-10-CM | POA: Diagnosis not present

## 2020-10-18 DIAGNOSIS — R200 Anesthesia of skin: Secondary | ICD-10-CM | POA: Diagnosis not present

## 2020-11-01 DIAGNOSIS — G5601 Carpal tunnel syndrome, right upper limb: Secondary | ICD-10-CM | POA: Diagnosis not present

## 2020-11-01 DIAGNOSIS — I10 Essential (primary) hypertension: Secondary | ICD-10-CM | POA: Diagnosis not present

## 2020-11-01 DIAGNOSIS — R200 Anesthesia of skin: Secondary | ICD-10-CM | POA: Diagnosis not present

## 2020-11-01 DIAGNOSIS — M5412 Radiculopathy, cervical region: Secondary | ICD-10-CM | POA: Diagnosis not present

## 2020-11-09 DIAGNOSIS — M2011 Hallux valgus (acquired), right foot: Secondary | ICD-10-CM | POA: Diagnosis not present

## 2020-11-09 DIAGNOSIS — M21622 Bunionette of left foot: Secondary | ICD-10-CM | POA: Diagnosis not present

## 2020-11-09 DIAGNOSIS — M21621 Bunionette of right foot: Secondary | ICD-10-CM | POA: Diagnosis not present

## 2020-11-09 DIAGNOSIS — M21612 Bunion of left foot: Secondary | ICD-10-CM | POA: Diagnosis not present

## 2020-11-09 DIAGNOSIS — M2012 Hallux valgus (acquired), left foot: Secondary | ICD-10-CM | POA: Diagnosis not present

## 2020-11-09 DIAGNOSIS — M21611 Bunion of right foot: Secondary | ICD-10-CM | POA: Diagnosis not present

## 2020-12-22 DIAGNOSIS — M21612 Bunion of left foot: Secondary | ICD-10-CM | POA: Diagnosis not present

## 2020-12-22 DIAGNOSIS — M79672 Pain in left foot: Secondary | ICD-10-CM | POA: Diagnosis not present

## 2020-12-22 DIAGNOSIS — M21611 Bunion of right foot: Secondary | ICD-10-CM | POA: Diagnosis not present

## 2021-01-12 DIAGNOSIS — Z86010 Personal history of colonic polyps: Secondary | ICD-10-CM | POA: Diagnosis not present

## 2021-01-12 DIAGNOSIS — R7301 Impaired fasting glucose: Secondary | ICD-10-CM | POA: Diagnosis not present

## 2021-01-12 DIAGNOSIS — I10 Essential (primary) hypertension: Secondary | ICD-10-CM | POA: Diagnosis not present

## 2021-01-12 DIAGNOSIS — M5090 Cervical disc disorder, unspecified, unspecified cervical region: Secondary | ICD-10-CM | POA: Diagnosis not present

## 2021-01-12 DIAGNOSIS — G4725 Circadian rhythm sleep disorder, jet lag type: Secondary | ICD-10-CM | POA: Diagnosis not present

## 2021-01-12 DIAGNOSIS — G5601 Carpal tunnel syndrome, right upper limb: Secondary | ICD-10-CM | POA: Diagnosis not present

## 2021-01-12 DIAGNOSIS — Z0000 Encounter for general adult medical examination without abnormal findings: Secondary | ICD-10-CM | POA: Diagnosis not present

## 2021-01-23 ENCOUNTER — Other Ambulatory Visit: Payer: Self-pay

## 2021-01-23 ENCOUNTER — Emergency Department (HOSPITAL_COMMUNITY)
Admission: EM | Admit: 2021-01-23 | Discharge: 2021-01-23 | Disposition: A | Discharge status: Home / Self Care | Payer: Medicare HMO | Attending: Emergency Medicine | Admitting: Emergency Medicine

## 2021-01-23 DIAGNOSIS — Z859 Personal history of malignant neoplasm, unspecified: Secondary | ICD-10-CM | POA: Diagnosis not present

## 2021-01-23 DIAGNOSIS — J45909 Unspecified asthma, uncomplicated: Secondary | ICD-10-CM | POA: Diagnosis not present

## 2021-01-23 DIAGNOSIS — I10 Essential (primary) hypertension: Secondary | ICD-10-CM | POA: Diagnosis not present

## 2021-01-23 DIAGNOSIS — Z79899 Other long term (current) drug therapy: Secondary | ICD-10-CM | POA: Insufficient documentation

## 2021-01-23 DIAGNOSIS — U071 COVID-19: Secondary | ICD-10-CM | POA: Diagnosis not present

## 2021-01-23 DIAGNOSIS — J029 Acute pharyngitis, unspecified: Secondary | ICD-10-CM | POA: Diagnosis present

## 2021-01-23 LAB — I-STAT CHEM 8, ED
BUN: 8 mg/dL (ref 8–23)
Calcium, Ion: 1.15 mmol/L (ref 1.15–1.40)
Chloride: 96 mmol/L — ABNORMAL LOW (ref 98–111)
Creatinine, Ser: 0.6 mg/dL (ref 0.44–1.00)
Glucose, Bld: 102 mg/dL — ABNORMAL HIGH (ref 70–99)
HCT: 40 % (ref 36.0–46.0)
Hemoglobin: 13.6 g/dL (ref 12.0–15.0)
Potassium: 3.6 mmol/L (ref 3.5–5.1)
Sodium: 136 mmol/L (ref 135–145)
TCO2: 28 mmol/L (ref 22–32)

## 2021-01-23 LAB — SARS CORONAVIRUS 2 (TAT 6-24 HRS): SARS Coronavirus 2: POSITIVE — AB

## 2021-01-23 MED ORDER — HYDROCODONE-ACETAMINOPHEN 5-325 MG PO TABS
1.0000 | ORAL_TABLET | Freq: Once | ORAL | Status: AC
  Administered 2021-01-23: 1 via ORAL
  Filled 2021-01-23: qty 1

## 2021-01-23 MED ORDER — NIRMATRELVIR/RITONAVIR (PAXLOVID)TABLET: ORAL_TABLET | ORAL | 0 refills | Status: DC

## 2021-01-23 NOTE — Discharge Instructions (Addendum)
Avoid being around other people until all of your symptoms are gone +2 days.  Use Tylenol for pain or fever.  Use Robitussin-DM for cough.  Start the medication sent to the pharmacy to help you recover quicker

## 2021-01-23 NOTE — ED Provider Notes (Signed)
[facility] DEPART[facility] Provider Note   CSN: [id number] Arrival date & time: 01/23/21  1225     History Chief Complaint  Patient presents with  . Covid Positive    Monica Monroe is a 65 y.o. female.  HPI She presents for evaluation of an illness present for 4 days, starting initially with sore throat, graduating to weakness, cough, malaise.  She has had 2 COVID vaccines but no booster.  She denies chest pain, shortness of breath, nausea or vomiting.  She is taking her usual medications as prescribed.  There are no other known modifying factors    Past Medical History:  Diagnosis Date  . Anxiety   . Arthritis   . Asthma   . Cancer ([place])   . Cataract   . Depression   . Hypertension   . Osteopenia     There are no problems to display for this patient.   Past Surgical History:  Procedure Laterality Date  . APPENDECTOMY    . AUGMENTATION MAMMAPLASTY Bilateral   . CERVICAL DISCECTOMY    . COSMETIC SURGERY    . REDUCTION MAMMAPLASTY Bilateral   . TONSILLECTOMY    . TUBAL LIGATION       OB History   No obstetric history on file.     Family History  Problem Relation Age of Onset  . Colon cancer Paternal Uncle   . Breast cancer Paternal Aunt   . Esophageal cancer Neg Hx   . Liver cancer Neg Hx   . Pancreatic cancer Neg Hx   . Rectal cancer Neg Hx   . Stomach cancer Neg Hx     Social History   Tobacco Use  . Smoking status: Never Smoker  . Smokeless tobacco: Never Used  Substance Use Topics  . Alcohol use: Yes    Comment: a drink 3 to 5 times a week.   . Drug use: No    Home Medications Prior to Admission medications   Medication Sig Start Date End Date Taking? Authorizing Provider  Baclofen 5 MG TABS Take 1 tablet by mouth 2 (two) times daily as needed. 06/07/20   [provider]  calcium carbonate (OS-CAL) 1250 (500 Ca) MG chewable tablet Chew 1 tablet by mouth daily.    [provider]  cephALEXin  (KEFLEX) 500 MG capsule Take 500 mg by mouth every 8 (eight) hours. 05/20/20   [provider]  doxycycline (VIBRA-TABS) 100 MG tablet Take 100 mg by mouth 2 (two) times daily.    [provider]  doxycycline (VIBRAMYCIN) 100 MG capsule Take 100 mg by mouth 2 (two) times daily. 02/26/20   [provider]  estrogen, conjugated,-medroxyprogesterone (PREMPRO) 0.625-2.5 MG tablet Take 1 tablet by mouth daily. Takes one half tablet daily.    [provider]  fluconazole (DIFLUCAN) 150 MG tablet Take 150 mg by mouth daily as needed. 03/02/20   [provider]  gabapentin (NEURONTIN) 300 MG capsule Take 300 mg by mouth daily.    [provider]  hydrochlorothiazide (HYDRODIURIL) 12.5 MG tablet Take 12.5 mg by mouth every morning. 05/11/20   [provider]  HYDROcodone-acetaminophen (NORCO) 7.5-325 MG tablet Take by mouth.    [provider]  HYDROcodone-acetaminophen (NORCO/VICODIN) 5-325 MG tablet Take by mouth. 06/16/16   [provider]  ketoconazole (NIZORAL) 2 % cream Apply topically daily. 06/07/20   [provider]  lidocaine (XYLOCAINE) 5 % ointment SMARTSIG:Topical 1-4 Times Daily PRN 03/02/20   [provider]  naproxen sodium (ALEVE) 220 MG tablet Take by mouth.    [provider]  olmesartan-hydrochlorothiazide (BENICAR HCT) 20-12.5 MG tablet Take 1 tablet by mouth daily. Takes half of a tablet daily.    [provider]  OVER THE COUNTER MEDICATION Stool softener daily. One tablet daily.    [provider]  OVER THE COUNTER MEDICATION Vitamin D 3. One tablet daily.    [provider]  tobramycin-dexamethasone [facility]) ophthalmic solution Place 1 drop into the right eye 4 (four) times daily. 05/21/20   [provider]  valACYclovir (VALTREX) 1000 MG tablet Take 1,000 mg by mouth 3 (three) times daily. 03/04/20   [provider]  zolpidem (AMBIEN) 10 MG  tablet Take 10 mg by mouth at bedtime as needed for sleep. Half a tablet PRN.    [provider]    Allergies    Percocet [oxycodone-acetaminophen]  Review of Systems   Review of Systems  All other systems reviewed and are negative.   Physical Exam Updated Vital Signs BP (!) 161/79 (BP Location: Left Arm)   Pulse 85   Temp 98.5 F (36.9 C) (Oral)   Resp 18   SpO2 100%   Physical Exam Vitals and nursing note reviewed.  Constitutional:      Appearance: She is well-developed.  HENT:     Head: Normocephalic and atraumatic.     Right Ear: External ear normal.     Left Ear: External ear normal.  Eyes:     Conjunctiva/sclera: Conjunctivae normal.     Pupils: Pupils are equal, round, and reactive to light.  Neck:     Trachea: Phonation normal.  Cardiovascular:     Rate and Rhythm: Normal rate.     Comments: Normal right radial pulse Pulmonary:     Effort: Pulmonary effort is normal. No respiratory distress.     Breath sounds: Normal breath sounds. No stridor.  Abdominal:     General: There is no distension.     Palpations: Abdomen is soft.     Tenderness: There is no abdominal tenderness.  Musculoskeletal:        General: Normal range of motion.     Cervical back: Normal range of motion and neck supple.  Skin:    General: Skin is warm and dry.  Neurological:     Mental Status: She is alert and oriented to person, place, and time.     Cranial Nerves: No cranial nerve deficit.     Sensory: No sensory deficit.     Motor: No abnormal muscle tone.     Coordination: Coordination normal.  Psychiatric:        Mood and Affect: Mood normal.        Behavior: Behavior normal.        Thought Content: Thought content normal.        Judgment: Judgment normal.     ED Results / Procedures / Treatments   Labs (all labs ordered are listed, but only abnormal results are displayed) Labs Reviewed - No data to display  EKG None  Radiology No results  found.  Procedures Procedures   Medications Ordered in ED Medications - No data to display  ED Course  I have reviewed the triage vital signs and the nursing notes.  Pertinent labs & imaging results that were available during my care of the patient were reviewed by me and considered in my medical decision making (see chart for details).    MDM  Rules/Calculators/A&P                           Patient Vitals for the past 24 hrs:  BP Temp Temp src Pulse Resp SpO2  01/23/21 1615 130/79 -- -- 79 14 100 %  01/23/21 1600 (!) 141/81 -- -- 82 19 98 %  01/23/21 1545 135/74 -- -- 77 16 100 %  01/23/21 1530 140/79 -- -- 88 19 98 %  01/23/21 1500 (!) 144/82 -- -- 89 17 99 %  01/23/21 1445 (!) 141/76 -- -- 78 14 99 %  01/23/21 1430 -- -- -- 82 14 100 %  01/23/21 1415 (!) 142/84 -- -- 83 19 100 %  01/23/21 1400 (!) 154/76 -- -- 84 17 98 %  01/23/21 1326 -- -- -- -- -- 100 %  01/23/21 1241 (!) 161/79 98.5 F (36.9 C) Oral 85 18 99 %    5:11 PM Reevaluation with update and discussion. After initial assessment and treatment, an updated evaluation reveals no change in clinical status. Daleen Bo   Medical Decision Making:  This patient is presenting for evaluation of URI, which does require a range of treatment options, and is a complaint that involves a moderate risk of morbidity and mortality. The differential diagnoses include URI, COVID infection, pneumonia. I decided to review old records, and in summary elderly female presenting for evaluation of symptoms consistent with COVID infection.  She tested positive at home. I did not require additional historical information from anyone.  Clinical Laboratory Tests Ordered, included Metabolic panel. Review indicates essentially normal.   Critical Interventions-evaluation, laboratory testing, discussion with pharmacist, observation  After These Interventions, the Patient was reevaluated and was found stable for discharge.  Patient started  on antiviral as outpatient.  Prescription sent to pharmacy.  Recommend symptomatic care and PCP follow-up.  CRITICAL CARE-no Performed by: Daleen Bo  Nursing Notes Reviewed/ Care Coordinated Applicable Imaging Reviewed Interpretation of Laboratory Data incorporated into ED treatment  The patient appears reasonably screened and/or stabilized for discharge and I doubt any other medical condition or other [facility] requiring further screening, evaluation, or treatment in the ED at this time prior to discharge.  Plan: Home Medications-continue usual p.o. symptomatic treatment of choice; Home Treatments-rest, fluids; return here if the recommended treatment, does not improve the symptoms; Recommended follow up-PCP, as needed     Final Clinical Impression(s) / ED Diagnoses Final diagnoses:  None    Rx / DC Orders ED Discharge Orders    None       Daleen Bo, MD 01/23/21 1713

## 2021-01-23 NOTE — ED Notes (Signed)
Pt called out asking about DC or food or both

## 2021-01-23 NOTE — ED Notes (Signed)
RN has been in pts room multiple times and explained that the doctor has to be the one to discharge pt. Pt states that if the dr does not discharge her soon, she will leave AMA. MD notified.

## 2021-03-18 DIAGNOSIS — Z4889 Encounter for other specified surgical aftercare: Secondary | ICD-10-CM | POA: Diagnosis not present

## 2021-03-18 DIAGNOSIS — M79672 Pain in left foot: Secondary | ICD-10-CM | POA: Diagnosis not present

## 2021-03-18 DIAGNOSIS — M21611 Bunion of right foot: Secondary | ICD-10-CM | POA: Diagnosis not present

## 2021-03-18 DIAGNOSIS — M79671 Pain in right foot: Secondary | ICD-10-CM | POA: Diagnosis not present

## 2021-03-18 DIAGNOSIS — M21612 Bunion of left foot: Secondary | ICD-10-CM | POA: Diagnosis not present

## 2021-03-23 DIAGNOSIS — M79671 Pain in right foot: Secondary | ICD-10-CM | POA: Diagnosis not present

## 2021-04-06 DIAGNOSIS — M2550 Pain in unspecified joint: Secondary | ICD-10-CM | POA: Diagnosis not present

## 2021-04-06 DIAGNOSIS — G4700 Insomnia, unspecified: Secondary | ICD-10-CM | POA: Diagnosis not present

## 2021-04-06 DIAGNOSIS — Z8249 Family history of ischemic heart disease and other diseases of the circulatory system: Secondary | ICD-10-CM | POA: Diagnosis not present

## 2021-04-06 DIAGNOSIS — I10 Essential (primary) hypertension: Secondary | ICD-10-CM | POA: Diagnosis not present

## 2021-04-06 DIAGNOSIS — M79671 Pain in right foot: Secondary | ICD-10-CM | POA: Diagnosis not present

## 2021-04-06 DIAGNOSIS — M545 Low back pain, unspecified: Secondary | ICD-10-CM | POA: Diagnosis not present

## 2021-04-06 DIAGNOSIS — K5900 Constipation, unspecified: Secondary | ICD-10-CM | POA: Diagnosis not present

## 2021-04-06 DIAGNOSIS — Z803 Family history of malignant neoplasm of breast: Secondary | ICD-10-CM | POA: Diagnosis not present

## 2021-04-06 DIAGNOSIS — Z79890 Hormone replacement therapy (postmenopausal): Secondary | ICD-10-CM | POA: Diagnosis not present

## 2021-04-06 DIAGNOSIS — M8580 Other specified disorders of bone density and structure, unspecified site: Secondary | ICD-10-CM | POA: Diagnosis not present

## 2021-04-06 DIAGNOSIS — M62838 Other muscle spasm: Secondary | ICD-10-CM | POA: Diagnosis not present

## 2021-05-10 DIAGNOSIS — Z01419 Encounter for gynecological examination (general) (routine) without abnormal findings: Secondary | ICD-10-CM | POA: Diagnosis not present

## 2021-05-10 DIAGNOSIS — N951 Menopausal and female climacteric states: Secondary | ICD-10-CM | POA: Diagnosis not present

## 2021-05-10 DIAGNOSIS — Z6821 Body mass index (BMI) 21.0-21.9, adult: Secondary | ICD-10-CM | POA: Diagnosis not present

## 2021-05-19 DIAGNOSIS — L821 Other seborrheic keratosis: Secondary | ICD-10-CM | POA: Diagnosis not present

## 2021-05-19 DIAGNOSIS — L814 Other melanin hyperpigmentation: Secondary | ICD-10-CM | POA: Diagnosis not present

## 2021-06-10 ENCOUNTER — Encounter: Payer: Self-pay | Admitting: Gastroenterology

## 2021-06-10 ENCOUNTER — Ambulatory Visit: Payer: Medicare HMO | Admitting: Gastroenterology

## 2021-06-10 VITALS — BP 120/80 | HR 75 | Ht 63.0 in | Wt 120.0 lb

## 2021-06-10 DIAGNOSIS — Z86010 Personal history of colonic polyps: Secondary | ICD-10-CM

## 2021-06-10 DIAGNOSIS — Z860101 Personal history of adenomatous and serrated colon polyps: Secondary | ICD-10-CM

## 2021-06-10 DIAGNOSIS — K582 Mixed irritable bowel syndrome: Secondary | ICD-10-CM

## 2021-06-10 MED ORDER — PLENVU 140 G PO SOLR: ORAL | 0 refills | Status: DC

## 2021-06-10 NOTE — Patient Instructions (Addendum)
You have been scheduled for a colonoscopy. Please follow written instructions given to you at your visit today.  Please pick up your prep supplies at the pharmacy within the next 1-3 days. If you use inhalers (even only as needed), please bring them with you on the day of your procedure.   Use Soluable fiber 2-3 times daily with meals  Benefiber/ Citrucel or Fibercon  Due to recent changes in healthcare laws, you may see the results of your imaging and laboratory studies on MyChart before your provider has had a chance to review them.  We understand that in some cases there may be results that are confusing or concerning to you. Not all laboratory results come back in the same time frame and the provider may be waiting for multiple results in order to interpret others.  Please give [place] 48 hours in order for your provider to thoroughly review all the results before contacting the office for clarification of your results.    If you are age 23 or older, your body mass index should be between 23-30. Your Body mass index is 21.26 kg/m. If this is out of the aforementioned range listed, please consider follow up with your Primary Care Provider.  If you are age 6 or younger, your body mass index should be between 19-25. Your Body mass index is 21.26 kg/m. If this is out of the aformentioned range listed, please consider follow up with your Primary Care Provider.   __________________________________________________________  The [facility] providers would like to encourage you to use [facility] to communicate with providers for non-urgent requests or questions.  Due to long hold times on the telephone, sending your provider a message by [facility] may be a faster and more efficient way to get a response.  Please allow 48 business hours for a response.  Please remember that this is for non-urgent requests.    I appreciate the  opportunity to care for you  Thank You   Harl Bowie , MD

## 2021-06-10 NOTE — Progress Notes (Signed)
Monica Monroe    [id number]    1956-07-25  Primary Care Physician:Ehinger, Herbie [place], MD  Referring Physician: Gaynelle Arabian, MD [address],  [place] [address]   Chief complaint:  IBS constipation and diarrhea  HPI: 65 year old very pleasant female here with complaints of alternating constipation and diarrhea She has history of adenomatous colon polyps and is due for surveillance colonoscopy. Denies any abdominal pain, vomiting, melena or rectal bleeding  Colonoscopy : 12/11/2017 - One 11 mm polyp in the ascending colon, removed with a cold snare. Resected and retrieved. - One 1 mm polyp in the sigmoid colon, removed with a cold biopsy forceps. Resected and retrieved. - One 3 mm polyp in the sigmoid colon, removed with a cold snare. Resected and retrieved. - Diverticulosis in the sigmoid colon. - Non-bleeding internal hemorrhoids.   Outpatient Encounter Medications as of 06/10/2021  Medication Sig   Baclofen 5 MG TABS Take 1 tablet by mouth 2 (two) times daily as needed.   estrogen, conjugated,-medroxyprogesterone (PREMPRO) 0.625-2.5 MG tablet Take 1 tablet by mouth daily. Takes one half tablet daily.   hydrochlorothiazide (HYDRODIURIL) 12.5 MG tablet Take 12.5 mg by mouth every morning.   HYDROcodone-acetaminophen (NORCO) 7.5-325 MG tablet Take by mouth.   OVER THE COUNTER MEDICATION Stool softener daily. One tablet daily.   OVER THE COUNTER MEDICATION Vitamin D 3. One tablet daily.   zolpidem (AMBIEN) 10 MG tablet Take 10 mg by mouth at bedtime as needed for sleep. Half a tablet PRN.   [DISCONTINUED] calcium carbonate (OS-CAL) 1250 (500 Ca) MG chewable tablet Chew 1 tablet by mouth daily.   [DISCONTINUED] cephALEXin (KEFLEX) 500 MG capsule Take 500 mg by mouth every 8 (eight) hours.   [DISCONTINUED] doxycycline (VIBRA-TABS) 100 MG tablet Take 100 mg by mouth 2 (two) times daily.   [DISCONTINUED] doxycycline (VIBRAMYCIN) 100 MG  capsule Take 100 mg by mouth 2 (two) times daily.   [DISCONTINUED] fluconazole (DIFLUCAN) 150 MG tablet Take 150 mg by mouth daily as needed.   [DISCONTINUED] gabapentin (NEURONTIN) 300 MG capsule Take 300 mg by mouth daily.   [DISCONTINUED] HYDROcodone-acetaminophen (NORCO/VICODIN) 5-325 MG tablet Take by mouth.   [DISCONTINUED] ketoconazole (NIZORAL) 2 % cream Apply topically daily.   [DISCONTINUED] lidocaine (XYLOCAINE) 5 % ointment SMARTSIG:Topical 1-4 Times Daily PRN   [DISCONTINUED] naproxen sodium (ALEVE) 220 MG tablet Take by mouth.   [DISCONTINUED] nirmatrelvir/ritonavir EUA (PAXLOVID) TABS Take nirmatrelvir (150 mg) 2 tablet(s) twice daily for 5 days and ritonavir (100 mg) one tablet twice daily for 5 days.   [DISCONTINUED] olmesartan-hydrochlorothiazide (BENICAR HCT) 20-12.5 MG tablet Take 1 tablet by mouth daily. Takes half of a tablet daily.   [DISCONTINUED] tobramycin-dexamethasone (TOBRADEX) ophthalmic solution Place 1 drop into the right eye 4 (four) times daily.   [DISCONTINUED] valACYclovir (VALTREX) 1000 MG tablet Take 1,000 mg by mouth 3 (three) times daily.   Facility-Administered Encounter Medications as of 06/10/2021  Medication   0.9 %  sodium chloride infusion    Allergies as of 06/10/2021 - Review Complete 06/10/2021  Allergen Reaction Noted   Percocet [oxycodone-acetaminophen] Swelling 11/27/2017    Past Medical History:  Diagnosis Date   Anxiety    Arthritis    Asthma    Cancer ([place])    Cataract    Depression    Hypertension    Osteopenia     Past Surgical History:  Procedure Laterality Date   APPENDECTOMY  AUGMENTATION MAMMAPLASTY Bilateral    CERVICAL DISCECTOMY     COSMETIC SURGERY     REDUCTION MAMMAPLASTY Bilateral    TONSILLECTOMY     TUBAL LIGATION      Family History  Problem Relation Age of Onset   Colon cancer Paternal Uncle    Breast cancer Paternal Aunt    Esophageal cancer Neg Hx    Liver cancer Neg Hx    Pancreatic cancer  Neg Hx    Rectal cancer Neg Hx    Stomach cancer Neg Hx     Social History   Socioeconomic History   Marital status: Married    Spouse name: Not on file   Number of children: Not on file   Years of education: Not on file   Highest education level: Not on file  Occupational History   Not on file  Tobacco Use   Smoking status: Never   Smokeless tobacco: Never  Substance and Sexual Activity   Alcohol use: Yes    Comment: a drink 3 to 5 times a week.    Drug use: No   Sexual activity: Not on file  Other Topics Concern   Not on file  Social History Narrative   Not on file   Social Determinants of Health   Financial Resource Strain: Not on file  Food Insecurity: Not on file  Transportation Needs: Not on file  Physical Activity: Not on file  Stress: Not on file  Social Connections: Not on file  Intimate Partner Violence: Not on file      Review of systems: All other review of systems negative except as mentioned in the HPI.   Physical Exam: Vitals:   06/10/21 0928  BP: 120/80  Pulse: 75   Body mass index is 21.26 kg/m. Gen:      No acute distress HEENT:  sclera anicteric Abd:      soft, non-tender; no palpable masses, no distension Ext:    No edema Neuro: alert and oriented x 3 Psych: normal mood and affect  Data Reviewed:  Reviewed labs, radiology imaging, old records and pertinent past GI work up   Assessment and Plan/Recommendations:  65 year old very pleasant female with irritable bowel syndrome alternating constipation and diarrhea Advised patient to start soluble fiber 1 to 2 tablespoon 3 times daily with meals Increase dietary fiber and water intake  History of adenomatous colon polyps, is due for surveillance colonoscopy Will schedule it during this visit The risks and benefits as well as alternatives of endoscopic procedure(s) have been discussed and reviewed. All questions answered. The patient agrees to proceed.   The patient was  provided an opportunity to ask questions and all were answered. The patient agreed with the plan and demonstrated an understanding of the instructions.  Damaris Hippo , MD    CC: Gaynelle Arabian, MD

## 2021-06-14 ENCOUNTER — Encounter: Payer: Self-pay | Admitting: Gastroenterology

## 2021-06-17 ENCOUNTER — Encounter: Payer: Self-pay | Admitting: Gastroenterology

## 2021-06-22 ENCOUNTER — Other Ambulatory Visit: Payer: Self-pay

## 2021-06-22 ENCOUNTER — Encounter: Payer: Self-pay | Admitting: Gastroenterology

## 2021-06-22 ENCOUNTER — Ambulatory Visit (AMBULATORY_SURGERY_CENTER): Payer: Medicare HMO | Admitting: Gastroenterology

## 2021-06-22 VITALS — BP 154/54 | HR 85 | Temp 98.2°F | Resp 18 | Ht 63.0 in | Wt 120.0 lb

## 2021-06-22 DIAGNOSIS — Z86010 Personal history of colonic polyps: Secondary | ICD-10-CM | POA: Diagnosis not present

## 2021-06-22 DIAGNOSIS — D122 Benign neoplasm of ascending colon: Secondary | ICD-10-CM | POA: Diagnosis not present

## 2021-06-22 MED ORDER — SODIUM CHLORIDE 0.9 % IV SOLN: 500.0000 mL | Freq: Once | INTRAVENOUS | Status: DC

## 2021-06-22 NOTE — Progress Notes (Signed)
VS completed by [place].   Pt's states no medical or surgical changes since previsit or office visit.  

## 2021-06-22 NOTE — Progress Notes (Signed)
Please refer to office visit note 06/10/21. No additional changes in H&P Patient is appropriate for planned procedure(s) and anesthesia in an ambulatory setting  K. Veena Brennen Camper , MD [phone]   

## 2021-06-22 NOTE — Op Note (Signed)
[facility] Patient Name: Monica Monroe Procedure Date: 06/22/2021 9:49 AM MRN: [id number] Endoscopist: Mauri Pole , MD Age: 65 Referring MD:  Date of Birth: 1955/10/13 Gender: Female Account #: [id number] Procedure:                Colonoscopy Indications:              High risk colon cancer surveillance: Personal                            history of colonic polyps, High risk colon cancer                            surveillance: Personal history of adenoma (10 mm or                            greater in size) Medicines:                Monitored Anesthesia Care Procedure:                Pre-Anesthesia Assessment:                           - Prior to the procedure, a History and Physical                            was performed, and patient medications and                            allergies were reviewed. The patient's tolerance of                            previous anesthesia was also reviewed. The risks                            and benefits of the procedure and the sedation                            options and risks were discussed with the patient.                            All questions were answered, and informed consent                            was obtained. Prior Anticoagulants: The patient has                            taken no previous anticoagulant or antiplatelet                            agents. ASA Grade Assessment: II - A patient with                            mild systemic disease. After reviewing the risks  and benefits, the patient was deemed in                            satisfactory condition to undergo the procedure.                           After obtaining informed consent, the colonoscope                            was passed under direct vision. Throughout the                            procedure, the patient's blood pressure, pulse, and                            oxygen saturations were monitored  continuously. The                            Olympus PCF-H190DL (#[id number]) Colonoscope was                            introduced through the anus and advanced to the the                            cecum, identified by appendiceal orifice and                            ileocecal valve. The colonoscopy was performed                            without difficulty. The patient tolerated the                            procedure well. The quality of the bowel                            preparation was excellent. The ileocecal valve,                            appendiceal orifice, and rectum were photographed. Scope In: 9:54:13 AM Scope Out: 10:09:06 AM Scope Withdrawal Time: 0 hours 9 minutes 46 seconds  Total Procedure Duration: 0 hours 14 minutes 53 seconds  Findings:                 The perianal and digital rectal examinations were                            normal.                           Two sessile polyps were found in the transverse                            colon. The polyps were 5 to 12 mm in size. These  polyps were removed with a cold snare. Resection                            and retrieval were complete.                           A few small-mouthed diverticula were found in the                            sigmoid colon and descending colon.                           Non-bleeding external and internal hemorrhoids were                            found during retroflexion. The hemorrhoids were                            medium-sized. Complications:            No immediate complications. Estimated Blood Loss:     Estimated blood loss was minimal. Impression:               - Two 5 to 12 mm polyps in the transverse colon,                            removed with a cold snare. Resected and retrieved.                           - Diverticulosis in the sigmoid colon and in the                            descending colon.                           - Non-bleeding  external and internal hemorrhoids. Recommendation:           - Patient has a contact number available for                            emergencies. The signs and symptoms of potential                            delayed complications were discussed with the                            patient. Return to normal activities tomorrow.                            Written discharge instructions were provided to the                            patient.                           - Resume previous diet.                           -  Continue present medications.                           - Await pathology results.                           - Repeat colonoscopy in 3 - 5 years for                            surveillance based on pathology results. Mauri Pole, MD 06/22/2021 10:24:17 AM This report has been signed electronically.

## 2021-06-22 NOTE — Patient Instructions (Signed)
Resume previous diet and continue current medications. Repeat colonoscopy in 3-5 years based on pathology for surveillance. Awaiting pathology results.  YOU HAD AN ENDOSCOPIC PROCEDURE TODAY AT [facility]:   Refer to the procedure report that was given to you for any specific questions about what was found during the examination.  If the procedure report does not answer your questions, please call your gastroenterologist to clarify.  If you requested that your care partner not be given the details of your procedure findings, then the procedure report has been included in a sealed envelope for you to review at your convenience later.  YOU SHOULD EXPECT: Some feelings of bloating in the abdomen. Passage of more gas than usual.  Walking can help get rid of the air that was put into your GI tract during the procedure and reduce the bloating. If you had a lower endoscopy (such as a colonoscopy or flexible sigmoidoscopy) you may notice spotting of blood in your stool or on the toilet paper. If you underwent a bowel prep for your procedure, you may not have a normal bowel movement for a few days.  Please Note:  You might notice some irritation and congestion in your nose or some drainage.  This is from the oxygen used during your procedure.  There is no need for concern and it should clear up in a day or so.  SYMPTOMS TO REPORT IMMEDIATELY:  Following lower endoscopy (colonoscopy or flexible sigmoidoscopy):  Excessive amounts of blood in the stool  Significant tenderness or worsening of abdominal pains  Swelling of the abdomen that is new, acute  Fever of 100F or higher  For urgent or emergent issues, a gastroenterologist can be reached at any hour by calling [phone]. Do not use MyChart messaging for urgent concerns.    DIET:  We do recommend a small meal at first, but then you may proceed to your regular diet.  Drink plenty of fluids but you should avoid alcoholic beverages  for 24 hours.  ACTIVITY:  You should plan to take it easy for the rest of today and you should NOT DRIVE or use heavy machinery until tomorrow (because of the sedation medicines used during the test).    FOLLOW UP: Our staff will call the number listed on your records 48-72 hours following your procedure to check on you and address any questions or concerns that you may have regarding the information given to you following your procedure. If we do not reach you, we will leave a message.  We will attempt to reach you two times.  During this call, we will ask if you have developed any symptoms of COVID 19. If you develop any symptoms (ie: fever, flu-like symptoms, shortness of breath, cough etc.) before then, please call [phone].  If you test positive for Covid 19 in the 2 weeks post procedure, please call and report this information to [place].    If any biopsies were taken you will be contacted by phone or by letter within the next 1-3 weeks.  Please call us at [phone] if you have not heard about the biopsies in 3 weeks.    SIGNATURES/CONFIDENTIALITY: You and/or your care partner have signed paperwork which will be entered into your electronic medical record.  These signatures attest to the fact that that the information above on your After Visit Summary has been reviewed and is understood.  Full responsibility of the confidentiality of this discharge information lies with you and/or your  care-partner.  

## 2021-06-22 NOTE — Progress Notes (Signed)
Called to room to assist during endoscopic procedure.  Patient ID and intended procedure confirmed with present staff. Received instructions for my participation in the procedure from the performing physician.  

## 2021-06-22 NOTE — Progress Notes (Signed)
Pt Drowsy. VSS. To PACU, report to RN. No anesthetic complications noted.  

## 2021-06-24 ENCOUNTER — Telehealth: Payer: Self-pay

## 2021-06-24 NOTE — Telephone Encounter (Signed)
  Follow up Call-  Call back number 06/22/2021  Post procedure Call Back phone  # [phone]  Permission to leave phone message Yes  Some recent data might be hidden     Patient questions:  Do you have a fever, pain , or abdominal swelling? No. Pain Score  0 *  Have you tolerated food without any problems? Yes.    Have you been able to return to your normal activities? Yes.    Do you have any questions about your discharge instructions: Diet   No. Medications  No. Follow up visit  No.  Do you have questions or concerns about your Care? No.  Actions: * If pain score is 4 or above: No action needed, pain <4.   Have you developed a fever since your procedure? No   2.   Have you had an respiratory symptoms (SOB or cough) since your procedure? No   3.   Have you tested positive for COVID 19 since your procedure no   4.   Have you had any family members/close contacts diagnosed with the COVID 19 since your procedure?  No    If yes to any of these questions please route to Joylene John, RN and Joella Prince, RN

## 2021-06-29 ENCOUNTER — Encounter: Payer: Self-pay | Admitting: Gastroenterology

## 2021-07-13 DIAGNOSIS — R7301 Impaired fasting glucose: Secondary | ICD-10-CM | POA: Diagnosis not present

## 2021-07-13 DIAGNOSIS — Z23 Encounter for immunization: Secondary | ICD-10-CM | POA: Diagnosis not present

## 2021-07-13 DIAGNOSIS — G4725 Circadian rhythm sleep disorder, jet lag type: Secondary | ICD-10-CM | POA: Diagnosis not present

## 2021-07-13 DIAGNOSIS — I10 Essential (primary) hypertension: Secondary | ICD-10-CM | POA: Diagnosis not present

## 2021-07-13 DIAGNOSIS — G5601 Carpal tunnel syndrome, right upper limb: Secondary | ICD-10-CM | POA: Diagnosis not present

## 2021-07-13 DIAGNOSIS — M5090 Cervical disc disorder, unspecified, unspecified cervical region: Secondary | ICD-10-CM | POA: Diagnosis not present

## 2021-09-27 DIAGNOSIS — B029 Zoster without complications: Secondary | ICD-10-CM | POA: Diagnosis not present

## 2021-12-06 DIAGNOSIS — Z961 Presence of intraocular lens: Secondary | ICD-10-CM | POA: Diagnosis not present

## 2021-12-12 DIAGNOSIS — J069 Acute upper respiratory infection, unspecified: Secondary | ICD-10-CM | POA: Diagnosis not present

## 2021-12-12 DIAGNOSIS — H6983 Other specified disorders of Eustachian tube, bilateral: Secondary | ICD-10-CM | POA: Diagnosis not present

## 2021-12-12 DIAGNOSIS — J029 Acute pharyngitis, unspecified: Secondary | ICD-10-CM | POA: Diagnosis not present

## 2022-01-03 DIAGNOSIS — Z0100 Encounter for examination of eyes and vision without abnormal findings: Secondary | ICD-10-CM | POA: Diagnosis not present

## 2022-01-27 DIAGNOSIS — Z1331 Encounter for screening for depression: Secondary | ICD-10-CM | POA: Diagnosis not present

## 2022-01-27 DIAGNOSIS — R7301 Impaired fasting glucose: Secondary | ICD-10-CM | POA: Diagnosis not present

## 2022-01-27 DIAGNOSIS — M5090 Cervical disc disorder, unspecified, unspecified cervical region: Secondary | ICD-10-CM | POA: Diagnosis not present

## 2022-01-27 DIAGNOSIS — G4725 Circadian rhythm sleep disorder, jet lag type: Secondary | ICD-10-CM | POA: Diagnosis not present

## 2022-01-27 DIAGNOSIS — G5601 Carpal tunnel syndrome, right upper limb: Secondary | ICD-10-CM | POA: Diagnosis not present

## 2022-01-27 DIAGNOSIS — I10 Essential (primary) hypertension: Secondary | ICD-10-CM | POA: Diagnosis not present

## 2022-01-27 DIAGNOSIS — Z0000 Encounter for general adult medical examination without abnormal findings: Secondary | ICD-10-CM | POA: Diagnosis not present

## 2022-01-30 DIAGNOSIS — Z86018 Personal history of other benign neoplasm: Secondary | ICD-10-CM | POA: Diagnosis not present

## 2022-01-30 DIAGNOSIS — L578 Other skin changes due to chronic exposure to nonionizing radiation: Secondary | ICD-10-CM | POA: Diagnosis not present

## 2022-01-30 DIAGNOSIS — D1801 Hemangioma of skin and subcutaneous tissue: Secondary | ICD-10-CM | POA: Diagnosis not present

## 2022-01-30 DIAGNOSIS — L821 Other seborrheic keratosis: Secondary | ICD-10-CM | POA: Diagnosis not present

## 2022-01-30 DIAGNOSIS — D485 Neoplasm of uncertain behavior of skin: Secondary | ICD-10-CM | POA: Diagnosis not present

## 2022-01-30 DIAGNOSIS — L570 Actinic keratosis: Secondary | ICD-10-CM | POA: Diagnosis not present

## 2022-01-30 DIAGNOSIS — D229 Melanocytic nevi, unspecified: Secondary | ICD-10-CM | POA: Diagnosis not present

## 2022-01-30 DIAGNOSIS — Z85828 Personal history of other malignant neoplasm of skin: Secondary | ICD-10-CM | POA: Diagnosis not present

## 2022-01-30 DIAGNOSIS — L814 Other melanin hyperpigmentation: Secondary | ICD-10-CM | POA: Diagnosis not present

## 2022-04-11 DIAGNOSIS — R21 Rash and other nonspecific skin eruption: Secondary | ICD-10-CM | POA: Diagnosis not present

## 2022-04-11 DIAGNOSIS — B029 Zoster without complications: Secondary | ICD-10-CM | POA: Diagnosis not present

## 2022-04-24 ENCOUNTER — Other Ambulatory Visit: Payer: Self-pay | Admitting: Family Medicine

## 2022-04-24 ENCOUNTER — Ambulatory Visit
Admission: RE | Admit: 2022-04-24 | Discharge: 2022-04-24 | Disposition: A | Discharge status: Home / Self Care | Payer: Medicare HMO | Source: Ambulatory Visit | Attending: Family Medicine | Admitting: Family Medicine

## 2022-04-24 DIAGNOSIS — M2550 Pain in unspecified joint: Secondary | ICD-10-CM | POA: Diagnosis not present

## 2022-04-24 DIAGNOSIS — M549 Dorsalgia, unspecified: Secondary | ICD-10-CM | POA: Diagnosis not present

## 2022-04-24 DIAGNOSIS — M79641 Pain in right hand: Secondary | ICD-10-CM

## 2022-04-24 DIAGNOSIS — M19042 Primary osteoarthritis, left hand: Secondary | ICD-10-CM | POA: Diagnosis not present

## 2022-04-24 DIAGNOSIS — R682 Dry mouth, unspecified: Secondary | ICD-10-CM | POA: Diagnosis not present

## 2022-04-24 DIAGNOSIS — I10 Essential (primary) hypertension: Secondary | ICD-10-CM | POA: Diagnosis not present

## 2022-04-24 DIAGNOSIS — M19041 Primary osteoarthritis, right hand: Secondary | ICD-10-CM | POA: Diagnosis not present

## 2022-07-03 ENCOUNTER — Encounter (HOSPITAL_BASED_OUTPATIENT_CLINIC_OR_DEPARTMENT_OTHER): Payer: Self-pay

## 2022-07-03 ENCOUNTER — Other Ambulatory Visit: Payer: Self-pay

## 2022-07-03 ENCOUNTER — Emergency Department (HOSPITAL_BASED_OUTPATIENT_CLINIC_OR_DEPARTMENT_OTHER)
Admission: EM | Admit: 2022-07-03 | Discharge: 2022-07-03 | Disposition: A | Discharge status: Home / Self Care | Payer: Medicare HMO | Attending: Emergency Medicine | Admitting: Emergency Medicine

## 2022-07-03 DIAGNOSIS — Z5321 Procedure and treatment not carried out due to patient leaving prior to being seen by health care provider: Secondary | ICD-10-CM | POA: Insufficient documentation

## 2022-07-03 DIAGNOSIS — L089 Local infection of the skin and subcutaneous tissue, unspecified: Secondary | ICD-10-CM | POA: Insufficient documentation

## 2022-07-03 NOTE — ED Triage Notes (Signed)
Laceration to inside of left ankle 2 weeks ago. States wound continues to ooze purulent drainage. No drainage noted in triage. Pt  has it covered with band aid

## 2022-07-03 NOTE — ED Notes (Signed)
Called for patient  No answer.  Front desk states patient has left

## 2022-07-03 NOTE — ED Notes (Signed)
Pt called 1x for room, no answer

## 2022-07-05 DIAGNOSIS — Z1231 Encounter for screening mammogram for malignant neoplasm of breast: Secondary | ICD-10-CM | POA: Diagnosis not present

## 2022-07-05 DIAGNOSIS — M8588 Other specified disorders of bone density and structure, other site: Secondary | ICD-10-CM | POA: Diagnosis not present

## 2022-07-05 DIAGNOSIS — Z6822 Body mass index (BMI) 22.0-22.9, adult: Secondary | ICD-10-CM | POA: Diagnosis not present

## 2022-07-05 DIAGNOSIS — Z01419 Encounter for gynecological examination (general) (routine) without abnormal findings: Secondary | ICD-10-CM | POA: Diagnosis not present

## 2022-07-31 DIAGNOSIS — I10 Essential (primary) hypertension: Secondary | ICD-10-CM | POA: Diagnosis not present

## 2022-07-31 DIAGNOSIS — G4725 Circadian rhythm sleep disorder, jet lag type: Secondary | ICD-10-CM | POA: Diagnosis not present

## 2022-07-31 DIAGNOSIS — M79601 Pain in right arm: Secondary | ICD-10-CM | POA: Diagnosis not present

## 2022-07-31 DIAGNOSIS — M5090 Cervical disc disorder, unspecified, unspecified cervical region: Secondary | ICD-10-CM | POA: Diagnosis not present

## 2022-07-31 DIAGNOSIS — M899 Disorder of bone, unspecified: Secondary | ICD-10-CM | POA: Diagnosis not present

## 2022-07-31 DIAGNOSIS — G5601 Carpal tunnel syndrome, right upper limb: Secondary | ICD-10-CM | POA: Diagnosis not present

## 2022-08-05 DIAGNOSIS — G4700 Insomnia, unspecified: Secondary | ICD-10-CM | POA: Diagnosis not present

## 2022-08-05 DIAGNOSIS — Z79891 Long term (current) use of opiate analgesic: Secondary | ICD-10-CM | POA: Diagnosis not present

## 2022-08-05 DIAGNOSIS — Z833 Family history of diabetes mellitus: Secondary | ICD-10-CM | POA: Diagnosis not present

## 2022-08-05 DIAGNOSIS — I10 Essential (primary) hypertension: Secondary | ICD-10-CM | POA: Diagnosis not present

## 2022-08-05 DIAGNOSIS — Z803 Family history of malignant neoplasm of breast: Secondary | ICD-10-CM | POA: Diagnosis not present

## 2022-08-05 DIAGNOSIS — K5900 Constipation, unspecified: Secondary | ICD-10-CM | POA: Diagnosis not present

## 2022-08-05 DIAGNOSIS — Z85828 Personal history of other malignant neoplasm of skin: Secondary | ICD-10-CM | POA: Diagnosis not present

## 2022-08-05 DIAGNOSIS — M1990 Unspecified osteoarthritis, unspecified site: Secondary | ICD-10-CM | POA: Diagnosis not present

## 2022-08-05 DIAGNOSIS — M62838 Other muscle spasm: Secondary | ICD-10-CM | POA: Diagnosis not present

## 2022-08-05 DIAGNOSIS — Z79890 Hormone replacement therapy (postmenopausal): Secondary | ICD-10-CM | POA: Diagnosis not present

## 2022-08-05 DIAGNOSIS — Z008 Encounter for other general examination: Secondary | ICD-10-CM | POA: Diagnosis not present

## 2022-08-05 DIAGNOSIS — Z8249 Family history of ischemic heart disease and other diseases of the circulatory system: Secondary | ICD-10-CM | POA: Diagnosis not present

## 2022-08-05 DIAGNOSIS — G629 Polyneuropathy, unspecified: Secondary | ICD-10-CM | POA: Diagnosis not present

## 2022-08-07 DIAGNOSIS — G5631 Lesion of radial nerve, right upper limb: Secondary | ICD-10-CM | POA: Diagnosis not present

## 2022-08-07 DIAGNOSIS — G5603 Carpal tunnel syndrome, bilateral upper limbs: Secondary | ICD-10-CM | POA: Diagnosis not present

## 2022-08-07 DIAGNOSIS — M7711 Lateral epicondylitis, right elbow: Secondary | ICD-10-CM | POA: Diagnosis not present

## 2022-09-04 DIAGNOSIS — G5631 Lesion of radial nerve, right upper limb: Secondary | ICD-10-CM | POA: Diagnosis not present

## 2022-09-04 DIAGNOSIS — G5601 Carpal tunnel syndrome, right upper limb: Secondary | ICD-10-CM | POA: Diagnosis not present

## 2022-09-14 DIAGNOSIS — G5601 Carpal tunnel syndrome, right upper limb: Secondary | ICD-10-CM | POA: Diagnosis not present

## 2022-09-20 ENCOUNTER — Encounter (HOSPITAL_BASED_OUTPATIENT_CLINIC_OR_DEPARTMENT_OTHER): Payer: Self-pay | Admitting: Orthopedic Surgery

## 2022-09-20 ENCOUNTER — Other Ambulatory Visit: Payer: Self-pay

## 2022-09-24 NOTE — H&P (Signed)
Preoperative History & Physical Exam  Surgeon: Matt Holmes, MD  Diagnosis: right carpal tunnel syndrome  Planned Procedure: Procedure(s) (LRB): CARPAL TUNNEL RELEASE (Right)  History of Present Illness:   Patient is a 67 y.o. female with symptoms consistent with right carpal tunnel syndrome who presents for surgical intervention. The risks, benefits and alternatives of surgical intervention were discussed and informed consent was obtained prior to surgery.  Past Medical History:  Past Medical History:  Diagnosis Date   Anxiety    Arthritis    Asthma    Cancer ([place])    Cataract    Depression    Hypertension    Osteopenia     Past Surgical History:  Past Surgical History:  Procedure Laterality Date   APPENDECTOMY     AUGMENTATION MAMMAPLASTY Bilateral    BUNIONECTOMY     CERVICAL DISCECTOMY     COSMETIC SURGERY     REDUCTION MAMMAPLASTY Bilateral    TONSILLECTOMY     TUBAL LIGATION      Medications:  Prior to Admission medications   Medication Sig Start Date End Date Taking? Authorizing Provider  Baclofen 5 MG TABS Take 1 tablet by mouth 2 (two) times daily as needed. 06/07/20  Yes [provider]  estrogen, conjugated,-medroxyprogesterone (PREMPRO) 0.625-2.5 MG tablet Take 1 tablet by mouth daily. Takes one half tablet daily.   Yes [provider]  hydrochlorothiazide (HYDRODIURIL) 12.5 MG tablet Take 12.5 mg by mouth every morning. 05/11/20  Yes [provider]  OVER THE COUNTER MEDICATION Stool softener daily. One tablet daily.   Yes [provider]  zolpidem (AMBIEN) 10 MG tablet Take 10 mg by mouth at bedtime as needed for sleep. Half a tablet PRN.   Yes [provider]  HYDROcodone-acetaminophen (NORCO) 7.5-325 MG tablet Take by mouth.    [provider]  OVER THE COUNTER MEDICATION Vitamin D 3. One tablet daily.    [provider]    Allergies:  Percocet [oxycodone-acetaminophen]  Review of  Systems: Negative except per HPI.  Physical Exam: Alert and oriented, NAD Head and neck: no masses, normal alignment CV: pulse intact Pulm: no increased work of breathing, respirations even and unlabored Abdomen: non-distended Extremities: extremities warm and well perfused  LABS: No results found for this or any previous visit (from the past 2160 hour(s)).   Complete History and Physical exam available in the office notes  Orene Desanctis

## 2022-09-25 ENCOUNTER — Encounter (HOSPITAL_BASED_OUTPATIENT_CLINIC_OR_DEPARTMENT_OTHER)
Admission: RE | Admit: 2022-09-25 | Discharge: 2022-09-25 | Disposition: A | Discharge status: Home / Self Care | Payer: Medicare HMO | Source: Ambulatory Visit | Attending: Orthopedic Surgery | Admitting: Orthopedic Surgery

## 2022-09-25 DIAGNOSIS — Z01810 Encounter for preprocedural cardiovascular examination: Secondary | ICD-10-CM | POA: Insufficient documentation

## 2022-09-25 DIAGNOSIS — G5601 Carpal tunnel syndrome, right upper limb: Secondary | ICD-10-CM | POA: Diagnosis present

## 2022-09-25 DIAGNOSIS — I10 Essential (primary) hypertension: Secondary | ICD-10-CM | POA: Diagnosis not present

## 2022-09-25 LAB — BASIC METABOLIC PANEL
Anion gap: 12 (ref 5–15)
BUN: 9 mg/dL (ref 8–23)
CO2: 24 mmol/L (ref 22–32)
Calcium: 9.4 mg/dL (ref 8.9–10.3)
Chloride: 97 mmol/L — ABNORMAL LOW (ref 98–111)
Creatinine, Ser: 0.72 mg/dL (ref 0.44–1.00)
GFR, Estimated: >60 mL/min (ref >60)
Glucose, Bld: 109 mg/dL — ABNORMAL HIGH (ref 70–99)
Potassium: 3.6 mmol/L (ref 3.5–5.1)
Sodium: 133 mmol/L — ABNORMAL LOW (ref 135–145)

## 2022-09-25 NOTE — Anesthesia Preprocedure Evaluation (Addendum)
Anesthesia Evaluation  Patient identified by MRN, date of birth, ID band Patient awake    Reviewed: Allergy & Precautions, NPO status , Patient's Chart, lab work & pertinent test results  History of Anesthesia Complications Negative for: history of anesthetic complications  Airway Mallampati: II  TM Distance: >3 FB Neck ROM: Full   Comment: Previous grade I view with MAC 3 Dental  (+) Teeth Intact, Dental Advisory Given 17 veneers:   Pulmonary neg shortness of breath, asthma , neg recent URI   Pulmonary exam normal breath sounds clear to auscultation       Cardiovascular hypertension (HCTZ), Pt. on medications (-) angina (-) Past MI, (-) Cardiac Stents, (-) CABG and (-) Orthopnea  Rhythm:Regular Rate:Normal     Neuro/Psych  PSYCHIATRIC DISORDERS Anxiety Depression    negative neurological ROS     GI/Hepatic negative GI ROS, Neg liver ROS,,,  Endo/Other  negative endocrine ROS    Renal/GU negative Renal ROS     Musculoskeletal  (+) Arthritis ,  osteopenia   Abdominal  (+) - obese  Peds  Hematology negative hematology ROS (+)   Anesthesia Other Findings   Reproductive/Obstetrics                             Anesthesia Physical Anesthesia Plan  ASA: 2  Anesthesia Plan: MAC   Post-op Pain Management:    Induction: Intravenous  PONV Risk Score and Plan: 2 and Ondansetron, Propofol infusion and Treatment may vary due to age or medical condition  Airway Management Planned: Natural Airway and Simple Face Mask  Additional Equipment:   Intra-op Plan:   Post-operative Plan:   Informed Consent: I have reviewed the patients History and Physical, chart, labs and discussed the procedure including the risks, benefits and alternatives for the proposed anesthesia with the patient or authorized representative who has indicated his/her understanding and acceptance.     Dental advisory  given  Plan Discussed with: CRNA and Anesthesiologist  Anesthesia Plan Comments: (Discussed with patient risks of MAC including, but not limited to, minor pain or discomfort, hearing people in the room, and possible need for backup general anesthesia. Risks for general anesthesia also discussed including, but not limited to, sore throat, hoarse voice, chipped/damaged teeth, injury to vocal cords, nausea and vomiting, allergic reactions, lung infection, heart attack, stroke, and death. All questions answered. )        Anesthesia Quick Evaluation

## 2022-09-25 NOTE — Progress Notes (Addendum)
Sent text reminding pt to come in for lab work and an EKG and pick up a drink.   Enhanced Recovery after Surgery  Enhanced Recovery after Surgery is a protocol used to improve the stress on your body and your recovery after surgery.  Patient Instructions  The night before surgery:  No food after midnight. ONLY clear liquids after midnight  The day of surgery (if you do NOT have diabetes):  Drink ONE (1) Pre-Surgery Clear Ensure as directed.   This drink was given to you during your hospital  pre-op appointment visit. The pre-op nurse will instruct you on the time to drink the  Pre-Surgery Ensure depending on your surgery time. Finish the drink at the designated time by the pre-op nurse.  Nothing else to drink after completing the  Pre-Surgery Clear Ensure.  The day of surgery (if you have diabetes): Drink ONE (1) Gatorade 2 (G2) as directed. This drink was given to you during your hospital  pre-op appointment visit.  The pre-op nurse will instruct you on the time to drink the   Gatorade 2 (G2) depending on your surgery time. Color of the Gatorade may vary. Red is not allowed. Nothing else to drink after completing the  Gatorade 2 (G2).         If office.you have questions, please contact your surgeon's office

## 2022-09-26 ENCOUNTER — Ambulatory Visit (HOSPITAL_BASED_OUTPATIENT_CLINIC_OR_DEPARTMENT_OTHER): Payer: Medicare HMO | Admitting: Anesthesiology

## 2022-09-26 ENCOUNTER — Ambulatory Visit (HOSPITAL_BASED_OUTPATIENT_CLINIC_OR_DEPARTMENT_OTHER)
Admission: RE | Admit: 2022-09-26 | Discharge: 2022-09-26 | Disposition: A | Discharge status: Home / Self Care | Payer: Medicare HMO | Source: Ambulatory Visit | Attending: Orthopedic Surgery | Admitting: Orthopedic Surgery

## 2022-09-26 ENCOUNTER — Other Ambulatory Visit: Payer: Self-pay

## 2022-09-26 ENCOUNTER — Encounter (HOSPITAL_BASED_OUTPATIENT_CLINIC_OR_DEPARTMENT_OTHER): Payer: Self-pay | Admitting: Orthopedic Surgery

## 2022-09-26 ENCOUNTER — Encounter (HOSPITAL_BASED_OUTPATIENT_CLINIC_OR_DEPARTMENT_OTHER)
Admission: RE | Disposition: A | Discharge status: Home / Self Care | Payer: Self-pay | Source: Ambulatory Visit | Attending: Orthopedic Surgery

## 2022-09-26 DIAGNOSIS — J45909 Unspecified asthma, uncomplicated: Secondary | ICD-10-CM | POA: Diagnosis not present

## 2022-09-26 DIAGNOSIS — I10 Essential (primary) hypertension: Secondary | ICD-10-CM | POA: Insufficient documentation

## 2022-09-26 DIAGNOSIS — G5601 Carpal tunnel syndrome, right upper limb: Secondary | ICD-10-CM | POA: Diagnosis not present

## 2022-09-26 DIAGNOSIS — Z0189 Encounter for other specified special examinations: Secondary | ICD-10-CM

## 2022-09-26 DIAGNOSIS — R69 Illness, unspecified: Secondary | ICD-10-CM | POA: Diagnosis not present

## 2022-09-26 HISTORY — PX: CARPAL TUNNEL RELEASE: SHX101

## 2022-09-26 SURGERY — CARPAL TUNNEL RELEASE
Anesthesia: Monitor Anesthesia Care | Laterality: Right

## 2022-09-26 MED ORDER — BUPIVACAINE HCL (PF) 0.5 % IJ SOLN
INTRAMUSCULAR | Status: DC | PRN
  Administered 2022-09-26: 5 mL

## 2022-09-26 MED ORDER — LACTATED RINGERS IV SOLN: INTRAVENOUS | Status: DC | PRN

## 2022-09-26 MED ORDER — KETOROLAC TROMETHAMINE 30 MG/ML IJ SOLN: 15.0000 mg | Freq: Once | INTRAMUSCULAR | Status: DC | PRN

## 2022-09-26 MED ORDER — PROPOFOL 10 MG/ML IV BOLUS
INTRAVENOUS | Status: AC
  Filled 2022-09-26: qty 20

## 2022-09-26 MED ORDER — DEXAMETHASONE SODIUM PHOSPHATE 10 MG/ML IJ SOLN
INTRAMUSCULAR | Status: AC
  Filled 2022-09-26: qty 1

## 2022-09-26 MED ORDER — LIDOCAINE-EPINEPHRINE 1 %-1:100000 IJ SOLN
INTRAMUSCULAR | Status: AC
  Filled 2022-09-26: qty 1

## 2022-09-26 MED ORDER — FENTANYL CITRATE (PF) 100 MCG/2ML IJ SOLN
INTRAMUSCULAR | Status: DC | PRN
  Administered 2022-09-26: 50 ug via INTRAVENOUS

## 2022-09-26 MED ORDER — FENTANYL CITRATE (PF) 100 MCG/2ML IJ SOLN: 25.0000 ug | INTRAMUSCULAR | Status: DC | PRN

## 2022-09-26 MED ORDER — LIDOCAINE HCL (PF) 1 % IJ SOLN
INTRAMUSCULAR | Status: AC
  Filled 2022-09-26: qty 30

## 2022-09-26 MED ORDER — BACITRACIN ZINC 500 UNIT/GM EX OINT
TOPICAL_OINTMENT | CUTANEOUS | Status: DC | PRN
  Administered 2022-09-26: 1 via TOPICAL

## 2022-09-26 MED ORDER — BUPIVACAINE HCL (PF) 0.5 % IJ SOLN
INTRAMUSCULAR | Status: AC
  Filled 2022-09-26: qty 30

## 2022-09-26 MED ORDER — 0.9 % SODIUM CHLORIDE (POUR BTL) OPTIME
TOPICAL | Status: DC | PRN
  Administered 2022-09-26: 100 mL

## 2022-09-26 MED ORDER — ONDANSETRON HCL 4 MG/2ML IJ SOLN
INTRAMUSCULAR | Status: AC
  Filled 2022-09-26: qty 2

## 2022-09-26 MED ORDER — PROPOFOL 500 MG/50ML IV EMUL
INTRAVENOUS | Status: DC | PRN
  Administered 2022-09-26: 125 ug/kg/min via INTRAVENOUS

## 2022-09-26 MED ORDER — HYDROCODONE-ACETAMINOPHEN 5-325 MG PO TABS: 1.0000 | ORAL_TABLET | Freq: Four times a day (QID) | ORAL | 0 refills | Status: AC | PRN

## 2022-09-26 MED ORDER — PROMETHAZINE HCL 25 MG/ML IJ SOLN: 6.2500 mg | INTRAMUSCULAR | Status: DC | PRN

## 2022-09-26 MED ORDER — FENTANYL CITRATE (PF) 100 MCG/2ML IJ SOLN
INTRAMUSCULAR | Status: AC
  Filled 2022-09-26: qty 2

## 2022-09-26 MED ORDER — MIDAZOLAM HCL 5 MG/5ML IJ SOLN
INTRAMUSCULAR | Status: DC | PRN
  Administered 2022-09-26: 2 mg via INTRAVENOUS

## 2022-09-26 MED ORDER — ONDANSETRON HCL 4 MG/2ML IJ SOLN
INTRAMUSCULAR | Status: DC | PRN
  Administered 2022-09-26: 4 mg via INTRAVENOUS

## 2022-09-26 MED ORDER — MIDAZOLAM HCL 2 MG/2ML IJ SOLN
INTRAMUSCULAR | Status: AC
  Filled 2022-09-26: qty 2

## 2022-09-26 SURGICAL SUPPLY — 27 items
BLADE SURG 15 STRL LF DISP TIS (BLADE) ×1 IMPLANT
BLADE SURG 15 STRL SS (BLADE) ×1
BNDG CMPR 9X4 STRL LF SNTH (GAUZE/BANDAGES/DRESSINGS) ×1
BNDG ELASTIC 4X5.8 VLCR STR LF (GAUZE/BANDAGES/DRESSINGS) ×1 IMPLANT
BNDG ESMARK 4X9 LF (GAUZE/BANDAGES/DRESSINGS) ×1 IMPLANT
COVER BACK TABLE 60X90IN (DRAPES) ×1 IMPLANT
CUFF TOURN SGL QUICK 18X4 (TOURNIQUET CUFF) ×1 IMPLANT
DRAPE EXTREMITY T 121X128X90 (DISPOSABLE) ×1 IMPLANT
DRAPE SURG 17X23 STRL (DRAPES) ×1 IMPLANT
DRSG EMULSION OIL 3X3 NADH (GAUZE/BANDAGES/DRESSINGS) ×1 IMPLANT
GAUZE SPONGE 4X4 12PLY STRL (GAUZE/BANDAGES/DRESSINGS) ×1 IMPLANT
GLOVE BIOGEL PI IND STRL 7.5 (GLOVE) ×1 IMPLANT
GOWN STRL REUS W/ TWL LRG LVL3 (GOWN DISPOSABLE) ×1 IMPLANT
GOWN STRL REUS W/TWL LRG LVL3 (GOWN DISPOSABLE) ×1
GOWN STRL REUS W/TWL XL LVL3 (GOWN DISPOSABLE) ×1 IMPLANT
KNIFE CARPAL TUNNEL (BLADE) ×1 IMPLANT
NEEDLE HYPO 22GX1.5 SAFETY (NEEDLE) ×1 IMPLANT
NS IRRIG 1000ML POUR BTL (IV SOLUTION) ×1 IMPLANT
PACK BASIN DAY SURGERY FS (CUSTOM PROCEDURE TRAY) ×1 IMPLANT
PADDING CAST ABS COTTON 4X4 ST (CAST SUPPLIES) ×1 IMPLANT
SHEET MEDIUM DRAPE 40X70 STRL (DRAPES) ×1 IMPLANT
SUT ETHILON 4 0 PS 2 18 (SUTURE) ×1 IMPLANT
SYR 10ML LL (SYRINGE) ×1 IMPLANT
SYR BULB EAR ULCER 3OZ GRN STR (SYRINGE) ×1 IMPLANT
TOWEL GREEN STERILE FF (TOWEL DISPOSABLE) ×2 IMPLANT
TRAY DSU PREP LF (CUSTOM PROCEDURE TRAY) ×1 IMPLANT
UNDERPAD 30X36 HEAVY ABSORB (UNDERPADS AND DIAPERS) ×1 IMPLANT

## 2022-09-26 NOTE — Op Note (Signed)
OPERATIVE NOTE  DATE OF PROCEDURE: 09/26/2022  SURGEON: Izell Gibsland, MD  PREOPERATIVE DIAGNOSIS: Right Carpal Tunnel Syndrome  POSTOPERATIVE DIAGNOSIS: Same  NAME OF PROCEDURE: Right Carpal Tunnel Release  ANESTHESIA: Local + MAC  SKIN PREPARATION: Hibiclens  ESTIMATED BLOOD LOSS: Minimal  IMPLANTS: none  INDICATIONS:  Monica Monroe is a 67 y.o. female who presents with right carpal tunnel syndrome, refractory to nonoperative treatment. The patient has decided to proceed with surgical intervention.  Risks, benefits and alternatives of operative management were discussed including, but not limited to, risks of anesthesia complications, infection, pain, persistent symptoms, stiffness, need for future surgery.  The patient understands, agrees and elects to proceed with surgery.    DESCRIPTION OF PROCEDURE: The patient was placed in the usual supine position and the right upper extremity was prepped and draped in normal sterile fashion.  After local block anesthetic to the right hand and wrist, a standard 1.5 cm incision was made in the midpalm.  This was carried down through the subcutaneous tissues and palmar fascia to the transverse carpal ligament.  The distal one-half of the transverse carpal ligament was incised longitudinally under direct vision using a 15 blade.  The carpal tunnel release guide was then placed under direct vision on the transverse carpal ligament and slid proximally.  The guide was palpated into appropriate alignment longitudinally.  Contact with the transverse carpal ligament was maintained throughout passing.  The blade was engaged into the guide and the remaining portion of the transverse carpal ligament released completely.  No other abnormalities were noted.  The wound was copiously irrigated and the skin closed using horizontal mattress 4-0 nylon sutures.  A light bulky dressing was placed.  The patient tolerated the procedure well and returned to the recovery room in stable  condition.  I was present for the entire surgical procedure.   Matt Holmes, MD

## 2022-09-26 NOTE — Transfer of Care (Signed)
Immediate Anesthesia Transfer of Care Note  Patient: Monica Monroe  Procedure(s) Performed: CARPAL TUNNEL RELEASE (Right)  Patient Location: PACU  Anesthesia Type:MAC  Level of Consciousness: awake, alert , and oriented  Airway & Oxygen Therapy: Patient Spontanous Breathing  Post-op Assessment: Report given to RN and Post -op Vital signs reviewed and stable  Post vital signs: Reviewed and stable  Last Vitals:  Vitals Value Taken Time  BP    Temp    Pulse 71 09/26/22 0756  Resp 14 09/26/22 0756  SpO2 98 % 09/26/22 0756  Vitals shown include unvalidated device data.  Last Pain:  Vitals:   09/26/22 0648  TempSrc: Oral  PainSc: 0-No pain      Patients Stated Pain Goal: 5 (00/45/99 7741)  Complications: No notable events documented.

## 2022-09-26 NOTE — Discharge Instructions (Addendum)
Orthopaedic Hand Surgery Discharge Instructions  WEIGHT BEARING STATUS: Non weight bearing on operative extremity  INCISION CARE: Keep dressing over your incision clean and dry until 5 days after surgery. You may shower by placing a waterproof covering over your dressing. Once dressing is removed, you may allow water to run over the incision and then place Band-Aids over incision. Do not scrub your incision or apply creams/lotions. Do not submerge your incision or swim for 3 weeks after surgery. Contact your surgeon or primary care doctor if you develop redness or drainage from your incision.   PAIN CONTROL: First line medications for post operative pain control are Tylenol (acetaminophen) and Motrin (ibuprofen) if you are able to take these medications. If you have been prescribed a medication these can be taken as breakthrough pain medications. Please note that some narcotic pain medication has acetaminophen added and you should never consume more than 4,000mg of acetaminophen in 24-hour period. Please note that if you are given Toradol (ketorolac) you should not take similar medications such as ibuprofen or naproxen.  DISCHARGE MEDICATIONS: If you have been prescribed medication it was sent electronically to your pharmacy. No changes have been made to your home medications.  ICE/ELEVATION: Ice and elevate your injured extremity as needed. Avoid direct contact of ice with skin.   BANDAGE FEELS TOO TIGHT: If your bandage feels too tight, first make sure you are elevating your fingers as much as possible. The outer layer of the bandage can be unwrapped and reapplied more loosely. If no improvement, you may carefully cut the inner layer longitudinally until the pressure has resolved and then rewrap the outer layer. If you are not comfortable with these instructions, please call the office and the bandage can be changed for you.   FOLLOW UP: You will be called after surgery with an appointment date and  time, however if you have not received a phone call within 3 days, please call during regular office hours at [phone] to schedule a post operative appointment.  Please Seek Medical Attention if: Call MD for: pain or pressure in chest, jaw, arm, back, neck  Call MD for: temperature greater than 101 F for more than 24 hrs Call MD for: difficulty breathing Call MD for: incision redness, bleeding, drainage  Call MD for: palpitations or feeling that the heart is racing  Call MD for: increased swelling in arm, leg, ankle, or abdomen  Call MD for: lightheadedness, dizziness, fainting Call 911 or go to ER for any medical emergency if you are not able to get in touch with your doctor   J. Reid Spears, MD Orthopaedic Hand Surgeon [facility] Office number: [phone] [address] [place], [place] [address]   Post Anesthesia Home Care Instructions  Activity: Get plenty of rest for the remainder of the day. A responsible individual must stay with you for 24 hours following the procedure.  For the next 24 hours, DO NOT: -Drive a car -Operate machinery -Drink alcoholic beverages -Take any medication unless instructed by your physician -Make any legal decisions or sign important papers.  Meals: Start with liquid foods such as gelatin or soup. Progress to regular foods as tolerated. Avoid greasy, spicy, heavy foods. If nausea and/or vomiting occur, drink only clear liquids until the nausea and/or vomiting subsides. Call your physician if vomiting continues.  Special Instructions/Symptoms: Your throat may feel dry or sore from the anesthesia or the breathing tube placed in your throat during surgery. If this causes discomfort, gargle with warm   salt water. The discomfort should disappear within 24 hours.  If you had a scopolamine patch placed behind your ear for the management of post- operative nausea and/or vomiting:  1. The medication in the patch is effective for 72  hours, after which it should be removed.  Wrap patch in a tissue and discard in the trash. Wash hands thoroughly with soap and water. 2. You may remove the patch earlier than 72 hours if you experience unpleasant side effects which may include dry mouth, dizziness or visual disturbances. 3. Avoid touching the patch. Wash your hands with soap and water after contact with the patch.

## 2022-09-26 NOTE — Interval H&P Note (Signed)
History and Physical Interval Note:  09/26/2022 7:26 AM  Monica Monroe  has presented today for surgery, with the diagnosis of right carpal tunnel syndrome.  The various methods of treatment have been discussed with the patient and family. After consideration of risks, benefits and other options for treatment, the patient has consented to  Procedure(s): CARPAL TUNNEL RELEASE (Right) as a surgical intervention.  The patient's history has been reviewed, patient examined, no change in status, stable for surgery.  I have reviewed the patient's chart and labs.  Questions were answered to the patient's satisfaction.     Orene Desanctis

## 2022-09-26 NOTE — Anesthesia Postprocedure Evaluation (Signed)
Anesthesia Post Note  Patient: Monica Monroe  Procedure(s) Performed: CARPAL TUNNEL RELEASE (Right)     Patient location during evaluation: PACU Anesthesia Type: MAC Level of consciousness: awake Pain management: pain level controlled Vital Signs Assessment: post-procedure vital signs reviewed and stable Respiratory status: spontaneous breathing, nonlabored ventilation and respiratory function stable Cardiovascular status: stable and blood pressure returned to baseline Postop Assessment: no apparent nausea or vomiting Anesthetic complications: no   No notable events documented.  Last Vitals:  Vitals:   09/26/22 0815 09/26/22 0829  BP: (!) 152/73 (!) 148/85  Pulse: (!) 56 76  Resp: (!) 9 16  Temp:  (!) 36.4 C  SpO2: 98% 96%    Last Pain:  Vitals:   09/26/22 0829  TempSrc:   PainSc: 0-No pain                 Nilda Simmer

## 2022-09-27 ENCOUNTER — Encounter (HOSPITAL_BASED_OUTPATIENT_CLINIC_OR_DEPARTMENT_OTHER): Payer: Self-pay | Admitting: Orthopedic Surgery

## 2022-09-27 NOTE — Progress Notes (Signed)
Left message stating courtesy call and if any questions or concerns please call the doctors office.  

## 2023-02-07 DIAGNOSIS — I10 Essential (primary) hypertension: Secondary | ICD-10-CM | POA: Diagnosis not present

## 2023-02-07 DIAGNOSIS — Z1331 Encounter for screening for depression: Secondary | ICD-10-CM | POA: Diagnosis not present

## 2023-02-07 DIAGNOSIS — G4725 Circadian rhythm sleep disorder, jet lag type: Secondary | ICD-10-CM | POA: Diagnosis not present

## 2023-02-07 DIAGNOSIS — M5090 Cervical disc disorder, unspecified, unspecified cervical region: Secondary | ICD-10-CM | POA: Diagnosis not present

## 2023-02-07 DIAGNOSIS — Z0000 Encounter for general adult medical examination without abnormal findings: Secondary | ICD-10-CM | POA: Diagnosis not present

## 2023-02-16 DIAGNOSIS — H35373 Puckering of macula, bilateral: Secondary | ICD-10-CM | POA: Diagnosis not present

## 2023-02-16 DIAGNOSIS — H43813 Vitreous degeneration, bilateral: Secondary | ICD-10-CM | POA: Diagnosis not present

## 2023-09-11 DIAGNOSIS — Z124 Encounter for screening for malignant neoplasm of cervix: Secondary | ICD-10-CM | POA: Diagnosis not present

## 2023-09-11 DIAGNOSIS — M5090 Cervical disc disorder, unspecified, unspecified cervical region: Secondary | ICD-10-CM | POA: Diagnosis not present

## 2023-09-11 DIAGNOSIS — G4725 Circadian rhythm sleep disorder, jet lag type: Secondary | ICD-10-CM | POA: Diagnosis not present

## 2023-09-11 DIAGNOSIS — Z6821 Body mass index (BMI) 21.0-21.9, adult: Secondary | ICD-10-CM | POA: Diagnosis not present

## 2023-09-11 DIAGNOSIS — Z1231 Encounter for screening mammogram for malignant neoplasm of breast: Secondary | ICD-10-CM | POA: Diagnosis not present

## 2023-09-11 DIAGNOSIS — I10 Essential (primary) hypertension: Secondary | ICD-10-CM | POA: Diagnosis not present

## 2023-09-11 DIAGNOSIS — Z01419 Encounter for gynecological examination (general) (routine) without abnormal findings: Secondary | ICD-10-CM | POA: Diagnosis not present

## 2024-05-12 ENCOUNTER — Other Ambulatory Visit: Payer: Self-pay | Admitting: Medical Genetics

## 2024-06-03 ENCOUNTER — Other Ambulatory Visit: Payer: Self-pay

## 2024-06-03 DIAGNOSIS — Z006 Encounter for examination for normal comparison and control in clinical research program: Secondary | ICD-10-CM

## 2024-06-13 LAB — GENECONNECT MOLECULAR SCREEN: Genetic Analysis Overall Interpretation: NEGATIVE

## 2024-09-03 ENCOUNTER — Other Ambulatory Visit (HOSPITAL_BASED_OUTPATIENT_CLINIC_OR_DEPARTMENT_OTHER): Payer: Self-pay | Admitting: Nurse Practitioner

## 2024-09-03 DIAGNOSIS — E2839 Other primary ovarian failure: Secondary | ICD-10-CM
